# Patient Record
Sex: Female | Born: 1960 | Race: White | Hispanic: No | Marital: Single | Smoking: Never smoker
Health system: Southern US, Community
[De-identification: ages and names within clinical notes are randomized; demographics above are authoritative.]

## PROBLEM LIST (undated history)

## (undated) DIAGNOSIS — E119 Type 2 diabetes mellitus without complications: Secondary | ICD-10-CM

## (undated) DIAGNOSIS — F419 Anxiety disorder, unspecified: Secondary | ICD-10-CM

## (undated) DIAGNOSIS — F439 Reaction to severe stress, unspecified: Secondary | ICD-10-CM

## (undated) DIAGNOSIS — F32A Depression, unspecified: Secondary | ICD-10-CM

## (undated) DIAGNOSIS — I10 Essential (primary) hypertension: Secondary | ICD-10-CM

## (undated) DIAGNOSIS — M25569 Pain in unspecified knee: Secondary | ICD-10-CM

## (undated) HISTORY — PX: BLADDER SURGERY: SHX569

---

## 2016-05-15 ENCOUNTER — Ambulatory Visit (HOSPITAL_COMMUNITY)
Admission: EM | Admit: 2016-05-15 | Discharge: 2016-05-15 | Disposition: A | Discharge status: Home / Self Care | Payer: Self-pay | Attending: Family Medicine | Admitting: Family Medicine

## 2016-05-15 ENCOUNTER — Encounter (HOSPITAL_COMMUNITY): Payer: Self-pay | Admitting: Family Medicine

## 2016-05-15 DIAGNOSIS — F419 Anxiety disorder, unspecified: Secondary | ICD-10-CM

## 2016-05-15 DIAGNOSIS — Z760 Encounter for issue of repeat prescription: Secondary | ICD-10-CM

## 2016-05-15 MED ORDER — OXYBUTYNIN CHLORIDE 5 MG PO TABS: 5.0000 mg | ORAL_TABLET | Freq: Once | ORAL | 3 refills | Status: AC

## 2016-05-15 MED ORDER — MELOXICAM 7.5 MG PO TABS: 7.5000 mg | ORAL_TABLET | Freq: Every day | ORAL | 3 refills | Status: DC

## 2016-05-15 MED ORDER — CITALOPRAM HYDROBROMIDE 40 MG PO TABS: 40.0000 mg | ORAL_TABLET | Freq: Every day | ORAL | 3 refills | Status: DC

## 2016-05-15 MED ORDER — GABAPENTIN 300 MG PO CAPS: 300.0000 mg | ORAL_CAPSULE | Freq: Every day | ORAL | 3 refills | Status: DC

## 2016-05-15 NOTE — ED Provider Notes (Signed)
CSN: [id number]     Arrival date & time 05/15/16  1400 History   First MD Initiated Contact with Patient 05/15/16 1504     Chief Complaint  Patient presents with  . Medication Refill   (Consider location/radiation/quality/duration/timing/severity/associated sxs/prior Treatment) [facility] [place]      Medication Refill  Medications/supplies requested:  Gabapentin Citalopram Meloxicam Oxybutynin Reason for request:  Clinic/provider not available Medications taken before: yes - see home medications   Patient has complete original prescription information: yes     History reviewed. No pertinent past medical history. Past Surgical History:  Procedure Laterality Date  . BLADDER SURGERY     History reviewed. No pertinent family history. Social History  Substance Use Topics  . Smoking status: Never Smoker  . Smokeless tobacco: Never Used  . Alcohol use Not on file   OB History    No data available     Review of Systems  Constitutional: Negative.   HENT: Negative.   Eyes: Negative.   Respiratory: Negative.   Cardiovascular: Negative.   Gastrointestinal: Negative.   Endocrine: Negative.   Genitourinary: Negative.   Musculoskeletal: Negative.   Skin: Negative.   Allergic/Immunologic: Negative.   Neurological: Negative.   Hematological: Negative.   Psychiatric/Behavioral: Negative.     Allergies  Codeine; Doxycycline; and Sulfa antibiotics  Home Medications   Prior to Admission medications   Medication Sig Start Date End Date Taking? Authorizing Provider  citalopram (CELEXA) 40 MG tablet Take 1 tablet (40 mg total) by mouth daily. 05/15/16   Deatra CanterWilliam J Quintin Hjort, FNP  gabapentin (NEURONTIN) 300 MG capsule Take 1 capsule (300 mg total) by mouth at bedtime. 05/15/16   Deatra CanterWilliam J Hayleigh Bawa, FNP  meloxicam (MOBIC) 7.5 MG tablet Take 1 tablet (7.5 mg total) by mouth daily. 05/15/16   Deatra CanterWilliam J Bode Pieper, FNP  oxybutynin (DITROPAN) 5 MG tablet Take 1  tablet (5 mg total) by mouth once. 05/15/16 05/15/16  Deatra CanterWilliam J Khadir Roam, FNP   Meds Ordered and Administered this Visit  Medications - No data to display  BP 121/85   Pulse 82   Temp 98.5 F (36.9 C)   Resp (!) 82   SpO2 100%  No data found.   Physical Exam  Constitutional: She appears well-developed and well-nourished.  HENT:  Head: Normocephalic and atraumatic.  Right Ear: External ear normal.  Left Ear: External ear normal.  Mouth/Throat: Oropharynx is clear and moist.  Eyes: Conjunctivae and EOM are normal. Pupils are equal, round, and reactive to light.  Neck: Normal range of motion. Neck supple.  Cardiovascular: Normal rate, regular rhythm and normal heart sounds.   Pulmonary/Chest: Effort normal and breath sounds normal.  Abdominal: Soft. Bowel sounds are normal.  Nursing note and vitals reviewed.   Urgent Care Course     Procedures (including critical care time)  Labs Review Labs Reviewed - No data to display  Imaging Review No results found.   Visual Acuity Review  Right Eye Distance:   Left Eye Distance:   Bilateral Distance:    Right Eye Near:   Left Eye Near:    Bilateral Near:         MDM   1. Medication refill    Gabapentin  300mg  po qhs Citalopram 40mg  po qd  Meloxicam 7.5mg  one po qd Oxybutynin 5mg  one po qd    Deatra CanterWilliam J Marda Breidenbach, FNP 05/15/16 1549

## 2016-05-15 NOTE — ED Triage Notes (Signed)
Pt here for med refill on all meds.

## 2016-09-27 ENCOUNTER — Ambulatory Visit (HOSPITAL_COMMUNITY)
Admission: EM | Admit: 2016-09-27 | Discharge: 2016-09-27 | Disposition: A | Discharge status: Home / Self Care | Payer: Self-pay

## 2016-09-27 ENCOUNTER — Encounter (HOSPITAL_COMMUNITY): Payer: Self-pay | Admitting: *Deleted

## 2016-09-27 DIAGNOSIS — Z760 Encounter for issue of repeat prescription: Secondary | ICD-10-CM

## 2016-09-27 MED ORDER — OXYBUTYNIN CHLORIDE 5 MG PO TABS: 5.0000 mg | ORAL_TABLET | Freq: Three times a day (TID) | ORAL | 6 refills | Status: DC

## 2016-09-27 MED ORDER — MELOXICAM 7.5 MG PO TABS: 7.5000 mg | ORAL_TABLET | Freq: Every day | ORAL | 6 refills | Status: DC

## 2016-09-27 MED ORDER — CITALOPRAM HYDROBROMIDE 40 MG PO TABS: 40.0000 mg | ORAL_TABLET | Freq: Every day | ORAL | 6 refills | Status: DC

## 2016-09-27 NOTE — ED Triage Notes (Signed)
Pt  Reports      Needs  A  Refill   Of  Her  meds         Verbalizes no  Complaints   Has  No pcp

## 2016-09-27 NOTE — ED Provider Notes (Signed)
CSN: [id number]     Arrival date & time 09/27/16  1005 History   None    Chief Complaint  Patient presents with  . Medication Refill   (Consider location/radiation/quality/duration/timing/severity/associated sxs/prior Treatment) Patient request refill on her medications     Medication Refill  Medications/supplies requested:   citalopram, oxybutynin, meloxicam Reason for request:  Clinic/provider not available Medications taken before: yes - see home medications   Patient has complete original prescription information: yes     History reviewed. No pertinent past medical history. Past Surgical History:  Procedure Laterality Date  . BLADDER SURGERY     History reviewed. No pertinent family history. Social History  Substance Use Topics  . Smoking status: Never Smoker  . Smokeless tobacco: Never Used  . Alcohol use Not on file   OB History    No data available     Review of Systems  Constitutional: Negative.   HENT: Negative.   Eyes: Negative.   Respiratory: Negative.   Cardiovascular: Negative.   Gastrointestinal: Negative.   Endocrine: Negative.   Genitourinary: Negative.   Musculoskeletal: Negative.   Allergic/Immunologic: Negative.   Neurological: Negative.   Hematological: Negative.   Psychiatric/Behavioral: Negative.     Allergies  Codeine; Doxycycline; and Sulfa antibiotics  Home Medications   Prior to Admission medications   Medication Sig Start Date End Date Taking? Authorizing Provider  citalopram (CELEXA) 40 MG tablet Take 1 tablet (40 mg total) by mouth daily. 09/27/16   Deatra Canterxford, Donneisha Beane J, FNP  gabapentin (NEURONTIN) 300 MG capsule Take 1 capsule (300 mg total) by mouth at bedtime. 05/15/16   Deatra Canterxford, Danija Gosa J, FNP  meloxicam (MOBIC) 7.5 MG tablet Take 1 tablet (7.5 mg total) by mouth daily. 09/27/16   Deatra Canterxford, Arryanna Holquin J, FNP  oxybutynin (DITROPAN) 5 MG tablet Take 1 tablet (5 mg total) by mouth 3 (three) times daily. 09/27/16   Deatra Canterxford, Sitlaly Gudiel J, FNP    Meds Ordered and Administered this Visit  Medications - No data to display  BP 129/73 (BP Location: Left Arm)   Pulse 78   Temp 98.6 F (37 C) (Oral)   Resp 18   SpO2 100%  No data found.   Physical Exam  Constitutional: She appears well-developed and well-nourished.  HENT:  Head: Normocephalic and atraumatic.  Eyes: Conjunctivae and EOM are normal. Pupils are equal, round, and reactive to light.  Neck: Normal range of motion. Neck supple.  Cardiovascular: Normal rate, regular rhythm and normal heart sounds.   Pulmonary/Chest: Effort normal and breath sounds normal.  Abdominal: Soft. Bowel sounds are normal.  Nursing note and vitals reviewed.   Urgent Care Course     Procedures (including critical care time)  Labs Review Labs Reviewed - No data to display  Imaging Review No results found.   Visual Acuity Review  Right Eye Distance:   Left Eye Distance:   Bilateral Distance:    Right Eye Near:   Left Eye Near:    Bilateral Near:         MDM  1. Medication refill  Get established with pcp when insurance available.      Deatra CanterOxford, Jacari Iannello J, FNP 09/27/16 1143

## 2017-06-01 ENCOUNTER — Encounter (HOSPITAL_COMMUNITY): Payer: Self-pay | Admitting: Emergency Medicine

## 2017-06-01 ENCOUNTER — Ambulatory Visit (HOSPITAL_COMMUNITY)
Admission: EM | Admit: 2017-06-01 | Discharge: 2017-06-01 | Disposition: A | Discharge status: Home / Self Care | Payer: BLUE CROSS/BLUE SHIELD | Attending: Family Medicine | Admitting: Family Medicine

## 2017-06-01 DIAGNOSIS — R51 Headache: Secondary | ICD-10-CM

## 2017-06-01 DIAGNOSIS — Z760 Encounter for issue of repeat prescription: Secondary | ICD-10-CM | POA: Diagnosis not present

## 2017-06-01 DIAGNOSIS — R519 Headache, unspecified: Secondary | ICD-10-CM

## 2017-06-01 DIAGNOSIS — G4440 Drug-induced headache, not elsewhere classified, not intractable: Secondary | ICD-10-CM | POA: Diagnosis not present

## 2017-06-01 HISTORY — DX: Reaction to severe stress, unspecified: F43.9

## 2017-06-01 HISTORY — DX: Pain in unspecified knee: M25.569

## 2017-06-01 MED ORDER — OXYBUTYNIN CHLORIDE 5 MG PO TABS: 5.0000 mg | ORAL_TABLET | Freq: Three times a day (TID) | ORAL | 6 refills | Status: DC

## 2017-06-01 MED ORDER — KETOROLAC TROMETHAMINE 60 MG/2ML IM SOLN
60.0000 mg | Freq: Once | INTRAMUSCULAR | Status: AC
  Administered 2017-06-01: 60 mg via INTRAMUSCULAR

## 2017-06-01 MED ORDER — CITALOPRAM HYDROBROMIDE 40 MG PO TABS: 40.0000 mg | ORAL_TABLET | Freq: Every day | ORAL | 6 refills | Status: DC

## 2017-06-01 MED ORDER — KETOROLAC TROMETHAMINE 60 MG/2ML IM SOLN
INTRAMUSCULAR | Status: AC
  Filled 2017-06-01: qty 2

## 2017-06-01 MED ORDER — MELOXICAM 7.5 MG PO TABS: 7.5000 mg | ORAL_TABLET | Freq: Every day | ORAL | 6 refills | Status: DC

## 2017-06-01 MED ORDER — GABAPENTIN 300 MG PO CAPS: 300.0000 mg | ORAL_CAPSULE | Freq: Every day | ORAL | 3 refills | Status: AC

## 2017-06-01 NOTE — ED Provider Notes (Signed)
[facility]   [id number] 06/01/17 Arrival Time: 1856   SUBJECTIVE:  Monique Soto is a 57 y.o. female who presents to the urgent care with complaint of out of her medications and needs refills, working on getting PCP, states shes also had a headache x1 week.  Headache began when she ran out of medicine. Patient's had no vomiting, diplopia, hemiplegia. The headache tends to come and go but is present all day long.  Past Medical History:  Diagnosis Date  . Knee pain   . Stress    No family history on file. Social History   Socioeconomic History  . Marital status: Single    Spouse name: Not on file  . Number of children: Not on file  . Years of education: Not on file  . Highest education level: Not on file  Social Needs  . Financial resource strain: Not on file  . Food insecurity - worry: Not on file  . Food insecurity - inability: Not on file  . Transportation needs - medical: Not on file  . Transportation needs - non-medical: Not on file  Occupational History  . Not on file  Tobacco Use  . Smoking status: Never Smoker  . Smokeless tobacco: Never Used  Substance and Sexual Activity  . Alcohol use: Not on file  . Drug use: Not on file  . Sexual activity: Not on file  Other Topics Concern  . Not on file  Social History Narrative  . Not on file   Current Meds  Medication Sig  . [DISCONTINUED] citalopram (CELEXA) 40 MG tablet Take 1 tablet (40 mg total) by mouth daily.  . [DISCONTINUED] gabapentin (NEURONTIN) 300 MG capsule Take 1 capsule (300 mg total) by mouth at bedtime.  . [DISCONTINUED] meloxicam (MOBIC) 7.5 MG tablet Take 1 tablet (7.5 mg total) by mouth daily.  . [DISCONTINUED] oxybutynin (DITROPAN) 5 MG tablet Take 1 tablet (5 mg total) by mouth 3 (three) times daily.   Allergies  Allergen Reactions  . Codeine   . Doxycycline   . Sulfa Antibiotics       ROS: As per HPI, remainder of ROS negative.   OBJECTIVE:   Vitals:   06/01/17 1938  BP: (!) 118/56  Pulse: 72  Resp: 18  Temp: 98.1 F (36.7 C)  SpO2: 99%     General appearance: alert; no distress Eyes: PERRL; EOMI; conjunctiva normal HENT: normocephalic; atraumatic; TMs normal, canal normal, external ears normal without trauma; nasal mucosa normal; oral mucosa normal Neck: supple Lungs: clear to auscultation bilaterally Heart: regular rate and rhythm Back: no CVA tenderness Extremities: no cyanosis or edema; symmetrical with no gross deformities Skin: warm and dry Neurologic: normal gait; grossly normal Psychological: alert and cooperative; normal mood and affect      Labs:  No results found for this or any previous visit.  Labs Reviewed - No data to display  No results found.     ASSESSMENT & PLAN:  1. Bad headache   2. Rebound headache     Meds ordered this encounter  Medications  . ketorolac (TORADOL) injection 60 mg  . citalopram (CELEXA) 40 MG tablet    Sig: Take 1 tablet (40 mg total) by mouth daily.    Dispense:  30 tablet    Refill:  6  . gabapentin (NEURONTIN) 300 MG capsule    Sig: Take 1 capsule (300 mg total) by mouth at bedtime.    Dispense:  30 capsule    Refill:  3  .  meloxicam (MOBIC) 7.5 MG tablet    Sig: Take 1 tablet (7.5 mg total) by mouth daily.    Dispense:  30 tablet    Refill:  6  . oxybutynin (DITROPAN) 5 MG tablet    Sig: Take 1 tablet (5 mg total) by mouth 3 (three) times daily.    Dispense:  30 tablet    Refill:  6    Reviewed expectations re: course of current medical issues. Questions answered. Outlined signs and symptoms indicating need for more acute intervention. Patient verbalized understanding. After Visit Summary given.    Procedures:      Elvina Sidle, MD 06/01/17 2031

## 2017-06-01 NOTE — ED Triage Notes (Signed)
Pt has been out of her medications and needs refills, working on getting PCP, states shes also had a headache x1 week.

## 2018-11-26 ENCOUNTER — Other Ambulatory Visit: Payer: Self-pay | Admitting: Obstetrics and Gynecology

## 2018-11-26 ENCOUNTER — Other Ambulatory Visit (HOSPITAL_COMMUNITY)
Admission: RE | Admit: 2018-11-26 | Discharge: 2018-11-26 | Disposition: A | Discharge status: Home / Self Care | Payer: BLUE CROSS/BLUE SHIELD | Source: Ambulatory Visit | Attending: Obstetrics and Gynecology | Admitting: Obstetrics and Gynecology

## 2018-11-26 DIAGNOSIS — Z124 Encounter for screening for malignant neoplasm of cervix: Secondary | ICD-10-CM | POA: Insufficient documentation

## 2018-11-27 LAB — CYTOLOGY - PAP

## 2020-07-28 HISTORY — PX: KNEE ARTHROSCOPY: SHX127

## 2020-08-08 ENCOUNTER — Observation Stay (HOSPITAL_COMMUNITY)
Admission: EM | Admit: 2020-08-08 | Discharge: 2020-08-10 | Disposition: A | Discharge status: Home / Self Care | Payer: BLUE CROSS/BLUE SHIELD | Attending: Emergency Medicine | Admitting: Emergency Medicine

## 2020-08-08 DIAGNOSIS — R109 Unspecified abdominal pain: Secondary | ICD-10-CM | POA: Diagnosis present

## 2020-08-08 DIAGNOSIS — K810 Acute cholecystitis: Secondary | ICD-10-CM | POA: Diagnosis present

## 2020-08-08 DIAGNOSIS — Z20822 Contact with and (suspected) exposure to covid-19: Secondary | ICD-10-CM | POA: Diagnosis not present

## 2020-08-08 DIAGNOSIS — K8012 Calculus of gallbladder with acute and chronic cholecystitis without obstruction: Principal | ICD-10-CM | POA: Insufficient documentation

## 2020-08-08 DIAGNOSIS — K819 Cholecystitis, unspecified: Secondary | ICD-10-CM

## 2020-08-08 HISTORY — DX: Anxiety disorder, unspecified: F41.9

## 2020-08-08 HISTORY — DX: Type 2 diabetes mellitus without complications: E11.9

## 2020-08-08 HISTORY — DX: Essential (primary) hypertension: I10

## 2020-08-08 HISTORY — DX: Depression, unspecified: F32.A

## 2020-08-08 NOTE — ED Triage Notes (Signed)
Brought in by [facility] EMS from home, c/o chest pain, nausea and vomiting - Cp started when she woke up this morning, constant, substernal, crushing non radiating. Nausea and vomiting started around 1700.    324mg  aspirin prior to EMS arrival. 4mg  zofran ODT.

## 2020-08-09 ENCOUNTER — Emergency Department (HOSPITAL_COMMUNITY): Payer: BLUE CROSS/BLUE SHIELD | Admitting: Anesthesiology

## 2020-08-09 ENCOUNTER — Other Ambulatory Visit: Payer: Self-pay

## 2020-08-09 ENCOUNTER — Emergency Department (HOSPITAL_COMMUNITY): Payer: BLUE CROSS/BLUE SHIELD

## 2020-08-09 ENCOUNTER — Encounter (HOSPITAL_COMMUNITY): Payer: Self-pay

## 2020-08-09 ENCOUNTER — Encounter (HOSPITAL_COMMUNITY)
Admission: EM | Disposition: A | Discharge status: Home / Self Care | Payer: Self-pay | Source: Home / Self Care | Attending: Emergency Medicine

## 2020-08-09 DIAGNOSIS — K810 Acute cholecystitis: Secondary | ICD-10-CM | POA: Diagnosis present

## 2020-08-09 HISTORY — PX: CHOLECYSTECTOMY: SHX55

## 2020-08-09 LAB — CBC WITH DIFFERENTIAL/PLATELET
Abs Immature Granulocytes: 0.05 10*3/uL (ref 0.00–0.07)
Basophils Absolute: 0.1 10*3/uL (ref 0.0–0.1)
Basophils Relative: 1 %
Eosinophils Absolute: 0 10*3/uL (ref 0.0–0.5)
Eosinophils Relative: 0 %
HCT: 41.7 % (ref 36.0–46.0)
Hemoglobin: 14.5 g/dL (ref 12.0–15.0)
Immature Granulocytes: 0 %
Lymphocytes Relative: 10 %
Lymphs Abs: 1.3 10*3/uL (ref 0.7–4.0)
MCH: 31.6 pg (ref 26.0–34.0)
MCHC: 34.8 g/dL (ref 30.0–36.0)
MCV: 90.8 fL (ref 80.0–100.0)
Monocytes Absolute: 0.6 10*3/uL (ref 0.1–1.0)
Monocytes Relative: 5 %
Neutro Abs: 11.2 10*3/uL — ABNORMAL HIGH (ref 1.7–7.7)
Neutrophils Relative %: 84 %
Platelets: 320 10*3/uL (ref 150–400)
RBC: 4.59 MIL/uL (ref 3.87–5.11)
RDW: 12.1 % (ref 11.5–15.5)
WBC: 13.3 10*3/uL — ABNORMAL HIGH (ref 4.0–10.5)
nRBC: 0 % (ref 0.0–0.2)

## 2020-08-09 LAB — URINALYSIS, ROUTINE W REFLEX MICROSCOPIC
Bacteria, UA: NONE SEEN
Bilirubin Urine: NEGATIVE
Glucose, UA: >=500 mg/dL — AB
Ketones, ur: 80 mg/dL — AB
Leukocytes,Ua: NEGATIVE
Nitrite: NEGATIVE
Protein, ur: NEGATIVE mg/dL
Specific Gravity, Urine: 1.036 — ABNORMAL HIGH (ref 1.005–1.030)
pH: 6 (ref 5.0–8.0)

## 2020-08-09 LAB — RESP PANEL BY RT-PCR (FLU A&B, COVID) ARPGX2
Influenza A by PCR: NEGATIVE
Influenza B by PCR: NEGATIVE
SARS Coronavirus 2 by RT PCR: NEGATIVE

## 2020-08-09 LAB — CBC
HCT: 41.9 % (ref 36.0–46.0)
Hemoglobin: 14.3 g/dL (ref 12.0–15.0)
MCH: 31.2 pg (ref 26.0–34.0)
MCHC: 34.1 g/dL (ref 30.0–36.0)
MCV: 91.3 fL (ref 80.0–100.0)
Platelets: 297 10*3/uL (ref 150–400)
RBC: 4.59 MIL/uL (ref 3.87–5.11)
RDW: 12.1 % (ref 11.5–15.5)
WBC: 16.6 10*3/uL — ABNORMAL HIGH (ref 4.0–10.5)
nRBC: 0 % (ref 0.0–0.2)

## 2020-08-09 LAB — CREATININE, SERUM
Creatinine, Ser: 0.71 mg/dL (ref 0.44–1.00)
GFR, Estimated: >60 mL/min (ref >60)

## 2020-08-09 LAB — LIPASE, BLOOD: Lipase: 29 U/L (ref 11–51)

## 2020-08-09 LAB — COMPREHENSIVE METABOLIC PANEL
ALT: 24 U/L (ref 0–44)
AST: 20 U/L (ref 15–41)
Albumin: 4 g/dL (ref 3.5–5.0)
Alkaline Phosphatase: 62 U/L (ref 38–126)
Anion gap: 12 (ref 5–15)
BUN: 7 mg/dL — ABNORMAL LOW (ref 8–23)
CO2: 22 mmol/L (ref 22–32)
Calcium: 9.1 mg/dL (ref 8.9–10.3)
Chloride: 98 mmol/L (ref 98–111)
Creatinine, Ser: 0.78 mg/dL (ref 0.44–1.00)
GFR, Estimated: >60 mL/min (ref >60)
Glucose, Bld: 164 mg/dL — ABNORMAL HIGH (ref 70–99)
Potassium: 4.2 mmol/L (ref 3.5–5.1)
Sodium: 132 mmol/L — ABNORMAL LOW (ref 135–145)
Total Bilirubin: 0.9 mg/dL (ref 0.3–1.2)
Total Protein: 7.1 g/dL (ref 6.5–8.1)

## 2020-08-09 LAB — GLUCOSE, CAPILLARY
Glucose-Capillary: 137 mg/dL — ABNORMAL HIGH (ref 70–99)
Glucose-Capillary: 159 mg/dL — ABNORMAL HIGH (ref 70–99)
Glucose-Capillary: 290 mg/dL — ABNORMAL HIGH (ref 70–99)
Glucose-Capillary: 196 mg/dL — ABNORMAL HIGH (ref 70–99)

## 2020-08-09 LAB — HEMOGLOBIN A1C
Hgb A1c MFr Bld: 6.6 % — ABNORMAL HIGH (ref 4.8–5.6)
Mean Plasma Glucose: 142.72 mg/dL

## 2020-08-09 SURGERY — LAPAROSCOPIC CHOLECYSTECTOMY
Anesthesia: General | Site: Abdomen

## 2020-08-09 MED ORDER — SODIUM CHLORIDE 0.9 % IV SOLN
INTRAVENOUS | Status: AC
  Filled 2020-08-09: qty 20

## 2020-08-09 MED ORDER — LACTATED RINGERS IV SOLN: INTRAVENOUS | Status: DC

## 2020-08-09 MED ORDER — ONDANSETRON 4 MG PO TBDP: 4.0000 mg | ORAL_TABLET | Freq: Four times a day (QID) | ORAL | Status: DC | PRN

## 2020-08-09 MED ORDER — ONDANSETRON HCL 4 MG/2ML IJ SOLN
INTRAMUSCULAR | Status: DC | PRN
  Administered 2020-08-09: 4 mg via INTRAVENOUS

## 2020-08-09 MED ORDER — DIPHENHYDRAMINE HCL 50 MG/ML IJ SOLN: 25.0000 mg | Freq: Four times a day (QID) | INTRAMUSCULAR | Status: DC | PRN

## 2020-08-09 MED ORDER — KETOROLAC TROMETHAMINE 15 MG/ML IJ SOLN: 15.0000 mg | Freq: Four times a day (QID) | INTRAMUSCULAR | Status: DC | PRN

## 2020-08-09 MED ORDER — INDOCYANINE GREEN 25 MG IV SOLR
7.5000 mg | Freq: Once | INTRAVENOUS | Status: DC
  Filled 2020-08-09: qty 10

## 2020-08-09 MED ORDER — INSULIN ASPART 100 UNIT/ML IJ SOLN
0.0000 [IU] | Freq: Three times a day (TID) | INTRAMUSCULAR | Status: DC
  Administered 2020-08-09: 11 [IU] via SUBCUTANEOUS
  Administered 2020-08-10: 4 [IU] via SUBCUTANEOUS

## 2020-08-09 MED ORDER — DEXAMETHASONE SODIUM PHOSPHATE 10 MG/ML IJ SOLN
INTRAMUSCULAR | Status: DC | PRN
  Administered 2020-08-09: 5 mg via INTRAVENOUS

## 2020-08-09 MED ORDER — DEXAMETHASONE SODIUM PHOSPHATE 10 MG/ML IJ SOLN
INTRAMUSCULAR | Status: AC
  Filled 2020-08-09: qty 1

## 2020-08-09 MED ORDER — FENTANYL CITRATE (PF) 100 MCG/2ML IJ SOLN
50.0000 ug | INTRAMUSCULAR | Status: DC | PRN
  Administered 2020-08-09 (×2): 50 ug via INTRAVENOUS
  Filled 2020-08-09 (×2): qty 2

## 2020-08-09 MED ORDER — PROPOFOL 10 MG/ML IV BOLUS
INTRAVENOUS | Status: AC
  Filled 2020-08-09: qty 20

## 2020-08-09 MED ORDER — POLYETHYLENE GLYCOL 3350 17 G PO PACK: 17.0000 g | PACK | Freq: Every day | ORAL | Status: DC | PRN

## 2020-08-09 MED ORDER — SODIUM CHLORIDE 0.9 % IV SOLN
2.0000 g | Freq: Once | INTRAVENOUS | Status: AC
  Administered 2020-08-09: 2 g via INTRAVENOUS
  Filled 2020-08-09: qty 20

## 2020-08-09 MED ORDER — PROPOFOL 10 MG/ML IV BOLUS
INTRAVENOUS | Status: DC | PRN
  Administered 2020-08-09: 110 mg via INTRAVENOUS

## 2020-08-09 MED ORDER — SIMETHICONE 80 MG PO CHEW
40.0000 mg | CHEWABLE_TABLET | Freq: Four times a day (QID) | ORAL | Status: DC | PRN
  Filled 2020-08-09: qty 1

## 2020-08-09 MED ORDER — ACETAMINOPHEN 500 MG PO TABS
1000.0000 mg | ORAL_TABLET | ORAL | Status: AC
  Administered 2020-08-09: 1000 mg via ORAL

## 2020-08-09 MED ORDER — LORATADINE 10 MG PO TABS
10.0000 mg | ORAL_TABLET | Freq: Every day | ORAL | Status: DC
  Administered 2020-08-10: 10 mg via ORAL
  Filled 2020-08-09: qty 1

## 2020-08-09 MED ORDER — LIDOCAINE 2% (20 MG/ML) 5 ML SYRINGE
INTRAMUSCULAR | Status: AC
  Filled 2020-08-09: qty 5

## 2020-08-09 MED ORDER — SODIUM CHLORIDE 0.9 % IV SOLN: Freq: Once | INTRAVENOUS | Status: AC

## 2020-08-09 MED ORDER — HYDROCODONE-ACETAMINOPHEN 10-325 MG PO TABS
1.0000 | ORAL_TABLET | Freq: Four times a day (QID) | ORAL | Status: DC | PRN
  Administered 2020-08-10: 1 via ORAL
  Filled 2020-08-09: qty 1

## 2020-08-09 MED ORDER — METOPROLOL TARTRATE 5 MG/5ML IV SOLN
5.0000 mg | Freq: Four times a day (QID) | INTRAVENOUS | Status: DC | PRN
  Filled 2020-08-09: qty 5

## 2020-08-09 MED ORDER — BUPROPION HCL ER (SR) 150 MG PO TB12
150.0000 mg | ORAL_TABLET | Freq: Two times a day (BID) | ORAL | Status: DC
  Administered 2020-08-09 – 2020-08-10 (×2): 150 mg via ORAL
  Filled 2020-08-09 (×2): qty 1

## 2020-08-09 MED ORDER — SUGAMMADEX SODIUM 200 MG/2ML IV SOLN
INTRAVENOUS | Status: DC | PRN
  Administered 2020-08-09: 200 mg via INTRAVENOUS

## 2020-08-09 MED ORDER — METOCLOPRAMIDE HCL 5 MG/ML IJ SOLN
5.0000 mg | Freq: Once | INTRAMUSCULAR | Status: AC
  Administered 2020-08-09: 5 mg via INTRAVENOUS
  Filled 2020-08-09: qty 2

## 2020-08-09 MED ORDER — CHLORHEXIDINE GLUCONATE 0.12 % MT SOLN
15.0000 mL | Freq: Once | OROMUCOSAL | Status: AC
  Administered 2020-08-09: 15 mL via OROMUCOSAL

## 2020-08-09 MED ORDER — LIDOCAINE 2% (20 MG/ML) 5 ML SYRINGE
INTRAMUSCULAR | Status: DC | PRN
  Administered 2020-08-09: 60 mg via INTRAVENOUS

## 2020-08-09 MED ORDER — HEMOSTATIC AGENTS (NO CHARGE) OPTIME
TOPICAL | Status: DC | PRN
  Administered 2020-08-09: 1 via TOPICAL

## 2020-08-09 MED ORDER — 0.9 % SODIUM CHLORIDE (POUR BTL) OPTIME
TOPICAL | Status: DC | PRN
  Administered 2020-08-09: 1000 mL

## 2020-08-09 MED ORDER — ONDANSETRON HCL 4 MG/2ML IJ SOLN
INTRAMUSCULAR | Status: AC
  Filled 2020-08-09: qty 4

## 2020-08-09 MED ORDER — SODIUM CHLORIDE 0.9 % IV SOLN: INTRAVENOUS | Status: DC

## 2020-08-09 MED ORDER — BUPIVACAINE-EPINEPHRINE (PF) 0.25% -1:200000 IJ SOLN
INTRAMUSCULAR | Status: AC
  Filled 2020-08-09: qty 30

## 2020-08-09 MED ORDER — ONDANSETRON HCL 4 MG/2ML IJ SOLN: 4.0000 mg | Freq: Four times a day (QID) | INTRAMUSCULAR | Status: DC | PRN

## 2020-08-09 MED ORDER — FENTANYL CITRATE (PF) 250 MCG/5ML IJ SOLN
INTRAMUSCULAR | Status: AC
  Filled 2020-08-09: qty 5

## 2020-08-09 MED ORDER — SODIUM CHLORIDE 0.9 % IR SOLN
Status: DC | PRN
  Administered 2020-08-09: 1000 mL

## 2020-08-09 MED ORDER — FENTANYL CITRATE (PF) 100 MCG/2ML IJ SOLN: 25.0000 ug | INTRAMUSCULAR | Status: DC | PRN

## 2020-08-09 MED ORDER — FENTANYL CITRATE (PF) 250 MCG/5ML IJ SOLN
INTRAMUSCULAR | Status: DC | PRN
  Administered 2020-08-09: 100 ug via INTRAVENOUS
  Administered 2020-08-09 (×2): 50 ug via INTRAVENOUS

## 2020-08-09 MED ORDER — ONDANSETRON HCL 4 MG/2ML IJ SOLN
4.0000 mg | Freq: Once | INTRAMUSCULAR | Status: AC
  Administered 2020-08-09: 4 mg via INTRAVENOUS
  Filled 2020-08-09: qty 2

## 2020-08-09 MED ORDER — ACETAMINOPHEN 500 MG PO TABS
ORAL_TABLET | ORAL | Status: AC
  Filled 2020-08-09: qty 2

## 2020-08-09 MED ORDER — LISINOPRIL 2.5 MG PO TABS
2.5000 mg | ORAL_TABLET | ORAL | Status: DC
  Administered 2020-08-09: 2.5 mg via ORAL
  Filled 2020-08-09: qty 1

## 2020-08-09 MED ORDER — SUCCINYLCHOLINE CHLORIDE 200 MG/10ML IV SOSY
PREFILLED_SYRINGE | INTRAVENOUS | Status: AC
  Filled 2020-08-09: qty 10

## 2020-08-09 MED ORDER — CHLORHEXIDINE GLUCONATE 0.12 % MT SOLN
OROMUCOSAL | Status: AC
  Filled 2020-08-09: qty 15

## 2020-08-09 MED ORDER — ROCURONIUM BROMIDE 10 MG/ML (PF) SYRINGE
PREFILLED_SYRINGE | INTRAVENOUS | Status: DC | PRN
  Administered 2020-08-09: 30 mg via INTRAVENOUS

## 2020-08-09 MED ORDER — MIDAZOLAM HCL 2 MG/2ML IJ SOLN
INTRAMUSCULAR | Status: AC
  Filled 2020-08-09: qty 2

## 2020-08-09 MED ORDER — INSULIN ASPART 100 UNIT/ML IJ SOLN: 0.0000 [IU] | Freq: Every day | INTRAMUSCULAR | Status: DC

## 2020-08-09 MED ORDER — ORAL CARE MOUTH RINSE: 15.0000 mL | Freq: Once | OROMUCOSAL | Status: AC

## 2020-08-09 MED ORDER — ROCURONIUM BROMIDE 10 MG/ML (PF) SYRINGE
PREFILLED_SYRINGE | INTRAVENOUS | Status: AC
  Filled 2020-08-09: qty 10

## 2020-08-09 MED ORDER — SODIUM CHLORIDE 0.9 % IV SOLN
8.0000 mg | Freq: Once | INTRAVENOUS | Status: AC
  Administered 2020-08-09: 8 mg via INTRAVENOUS
  Filled 2020-08-09: qty 4

## 2020-08-09 MED ORDER — DIPHENHYDRAMINE HCL 25 MG PO CAPS: 25.0000 mg | ORAL_CAPSULE | Freq: Four times a day (QID) | ORAL | Status: DC | PRN

## 2020-08-09 MED ORDER — OXYBUTYNIN CHLORIDE ER 10 MG PO TB24
10.0000 mg | ORAL_TABLET | Freq: Every day | ORAL | Status: DC
  Administered 2020-08-10: 10 mg via ORAL
  Filled 2020-08-09: qty 1

## 2020-08-09 MED ORDER — ACETAMINOPHEN 500 MG PO TABS
1000.0000 mg | ORAL_TABLET | Freq: Four times a day (QID) | ORAL | Status: DC
  Administered 2020-08-09 – 2020-08-10 (×4): 1000 mg via ORAL
  Filled 2020-08-09 (×4): qty 2

## 2020-08-09 MED ORDER — MIDAZOLAM HCL 2 MG/2ML IJ SOLN
INTRAMUSCULAR | Status: DC | PRN
  Administered 2020-08-09: 2 mg via INTRAVENOUS

## 2020-08-09 MED ORDER — FENTANYL CITRATE (PF) 100 MCG/2ML IJ SOLN
50.0000 ug | Freq: Once | INTRAMUSCULAR | Status: AC
  Administered 2020-08-09: 50 ug via INTRAVENOUS
  Filled 2020-08-09: qty 2

## 2020-08-09 MED ORDER — GABAPENTIN 300 MG PO CAPS: 300.0000 mg | ORAL_CAPSULE | Freq: Every evening | ORAL | Status: DC | PRN

## 2020-08-09 MED ORDER — SUCCINYLCHOLINE CHLORIDE 200 MG/10ML IV SOSY
PREFILLED_SYRINGE | INTRAVENOUS | Status: DC | PRN
  Administered 2020-08-09: 180 mg via INTRAVENOUS

## 2020-08-09 MED ORDER — ONDANSETRON HCL 4 MG/2ML IJ SOLN
INTRAMUSCULAR | Status: AC
  Filled 2020-08-09: qty 2

## 2020-08-09 MED ORDER — BUPIVACAINE-EPINEPHRINE 0.25% -1:200000 IJ SOLN
INTRAMUSCULAR | Status: DC | PRN
  Administered 2020-08-09: 11 mL

## 2020-08-09 MED ORDER — ENOXAPARIN SODIUM 40 MG/0.4ML IJ SOSY
40.0000 mg | PREFILLED_SYRINGE | INTRAMUSCULAR | Status: DC
  Administered 2020-08-10: 40 mg via SUBCUTANEOUS
  Filled 2020-08-09: qty 0.4

## 2020-08-09 MED ORDER — METHOCARBAMOL 500 MG PO TABS
500.0000 mg | ORAL_TABLET | Freq: Four times a day (QID) | ORAL | Status: DC | PRN
  Administered 2020-08-10: 500 mg via ORAL
  Filled 2020-08-09: qty 1

## 2020-08-09 MED ORDER — FENTANYL CITRATE (PF) 100 MCG/2ML IJ SOLN
100.0000 ug | Freq: Once | INTRAMUSCULAR | Status: AC
  Administered 2020-08-09: 100 ug via INTRAVENOUS
  Filled 2020-08-09: qty 2

## 2020-08-09 SURGICAL SUPPLY — 37 items
APPLIER CLIP 5 13 M/L LIGAMAX5 (MISCELLANEOUS) ×2
CANISTER SUCT 3000ML PPV (MISCELLANEOUS) ×2 IMPLANT
CHLORAPREP W/TINT 26 (MISCELLANEOUS) ×2 IMPLANT
CLIP APPLIE 5 13 M/L LIGAMAX5 (MISCELLANEOUS) ×1 IMPLANT
COVER SURGICAL LIGHT HANDLE (MISCELLANEOUS) ×2 IMPLANT
COVER WAND RF STERILE (DRAPES) ×2 IMPLANT
DERMABOND ADVANCED (GAUZE/BANDAGES/DRESSINGS) ×1
DERMABOND ADVANCED .7 DNX12 (GAUZE/BANDAGES/DRESSINGS) ×1 IMPLANT
ELECT REM PT RETURN 9FT ADLT (ELECTROSURGICAL) ×2
ELECTRODE REM PT RTRN 9FT ADLT (ELECTROSURGICAL) ×1 IMPLANT
GLOVE BIO SURGEON STRL SZ7 (GLOVE) ×2 IMPLANT
GLOVE BIOGEL PI IND STRL 7.5 (GLOVE) ×1 IMPLANT
GLOVE BIOGEL PI INDICATOR 7.5 (GLOVE) ×1
GOWN STRL REUS W/ TWL LRG LVL3 (GOWN DISPOSABLE) ×3 IMPLANT
GOWN STRL REUS W/TWL LRG LVL3 (GOWN DISPOSABLE) ×3
GRASPER SUT TROCAR 14GX15 (MISCELLANEOUS) ×2 IMPLANT
HEMOSTAT SNOW SURGICEL 2X4 (HEMOSTASIS) ×2 IMPLANT
KIT BASIN OR (CUSTOM PROCEDURE TRAY) ×2 IMPLANT
KIT TURNOVER KIT B (KITS) ×2 IMPLANT
NS IRRIG 1000ML POUR BTL (IV SOLUTION) ×2 IMPLANT
PAD ARMBOARD 7.5X6 YLW CONV (MISCELLANEOUS) ×2 IMPLANT
POUCH RETRIEVAL ECOSAC 10 (ENDOMECHANICALS) ×1 IMPLANT
POUCH RETRIEVAL ECOSAC 10MM (ENDOMECHANICALS) ×1
SCISSORS LAP 5X35 DISP (ENDOMECHANICALS) ×2 IMPLANT
SET IRRIG TUBING LAPAROSCOPIC (IRRIGATION / IRRIGATOR) ×2 IMPLANT
SET TUBE SMOKE EVAC HIGH FLOW (TUBING) ×2 IMPLANT
SLEEVE ENDOPATH XCEL 5M (ENDOMECHANICALS) ×4 IMPLANT
SPECIMEN JAR SMALL (MISCELLANEOUS) ×2 IMPLANT
STRIP CLOSURE SKIN 1/2X4 (GAUZE/BANDAGES/DRESSINGS) ×2 IMPLANT
SUT MNCRL AB 4-0 PS2 18 (SUTURE) ×2 IMPLANT
SUT VICRYL 0 UR6 27IN ABS (SUTURE) ×2 IMPLANT
TOWEL GREEN STERILE (TOWEL DISPOSABLE) ×2 IMPLANT
TOWEL GREEN STERILE FF (TOWEL DISPOSABLE) ×2 IMPLANT
TRAY LAPAROSCOPIC MC (CUSTOM PROCEDURE TRAY) ×2 IMPLANT
TROCAR XCEL BLUNT TIP 100MML (ENDOMECHANICALS) ×2 IMPLANT
TROCAR XCEL NON-BLD 5MMX100MML (ENDOMECHANICALS) ×2 IMPLANT
WATER STERILE IRR 1000ML POUR (IV SOLUTION) ×2 IMPLANT

## 2020-08-09 NOTE — Interval H&P Note (Signed)
History and Physical Interval Note:  08/09/2020 8:08 AM I have seen and examined patient.  She has murphys sign gallstones and hydrops on [place].  Plan lap chole this am I discussed the procedure in detail.  We discussed the risks and benefits of a laparoscopic cholecystectomy and possible cholangiogram including, but not limited to bleeding, infection, injury to surrounding structures such as the intestine or liver, bile leak, retained gallstones, need to convert to an open procedure, subtotal, prolonged diarrhea, blood clots such as  DVT, common bile duct injury, anesthesia risks, and possible need for additional procedures.  The likelihood of improvement in symptoms and return to the patient's normal status is good. We discussed the typical post-operative recovery course.  Monique Soto  has presented today for surgery, with the diagnosis of Cholecystitis.  The various methods of treatment have been discussed with the patient and family. After consideration of risks, benefits and other options for treatment, the patient has consented to  Procedure(s): LAPAROSCOPIC CHOLECYSTECTOMY (N/A) as a surgical intervention.  The patient's history has been reviewed, patient examined, no change in status, stable for surgery.  I have reviewed the patient's chart and labs.  Questions were answered to the patient's satisfaction.     Monique Soto

## 2020-08-09 NOTE — Anesthesia Procedure Notes (Signed)
Procedure Name: Intubation Date/Time: 08/09/2020 12:18 PM Performed by: Mayer Camel, CRNA Pre-anesthesia Checklist: Patient identified, Emergency Drugs available, Suction available and Patient being monitored Patient Re-evaluated:Patient Re-evaluated prior to induction Oxygen Delivery Method: Circle System Utilized Preoxygenation: Pre-oxygenation with 100% oxygen Induction Type: IV induction Ventilation: Mask ventilation without difficulty Laryngoscope Size: Miller and 2 Grade View: Grade I Tube type: Oral Tube size: 7.0 mm Number of attempts: 1 Airway Equipment and Method: Stylet and Oral airway Placement Confirmation: ETT inserted through vocal cords under direct vision,  positive ETCO2 and breath sounds checked- equal and bilateral Secured at: 22 cm Tube secured with: Tape Dental Injury: Teeth and Oropharynx as per pre-operative assessment

## 2020-08-09 NOTE — ED Provider Notes (Signed)
[facility] EMERGENCY DEPARTMENT Provider Note   CSN: [id number] Arrival date & time: 08/08/20  2356     History Chief Complaint  Patient presents with  . Chest Pain  . Nausea  . Emesis    Tyjanae Bartek is a 60 y.o. female.  Patient presents to the emergency department with a chief complaint of epigastric or right upper quadrant pain.  She reports associated nausea and vomiting.  She states symptoms started yesterday morning.  She states that initially started in her right flank/back.  She states that is working well and her right upper abdomen does not radiate to her chest.  She reports persistent vomiting.  She denies any fevers or chills.  Denies any dysuria or hematuria.  Denies any successful treatments prior to arrival.  The history is provided by the patient. No language interpreter was used.       Past Medical History:  Diagnosis Date  . Knee pain   . Stress     There are no problems to display for this patient.   Past Surgical History:  Procedure Laterality Date  . BLADDER SURGERY       OB History   No obstetric history on file.     History reviewed. No pertinent family history.  Social History   Tobacco Use  . Smoking status: Never Smoker  . Smokeless tobacco: Never Used    Home Medications Prior to Admission medications   Medication Sig Start Date End Date Taking? Authorizing Provider  citalopram (CELEXA) 40 MG tablet Take 1 tablet (40 mg total) by mouth daily. 06/01/17   Elvina Sidle, MD  gabapentin (NEURONTIN) 300 MG capsule Take 1 capsule (300 mg total) by mouth at bedtime. 06/01/17   Elvina Sidle, MD  meloxicam (MOBIC) 7.5 MG tablet Take 1 tablet (7.5 mg total) by mouth daily. 06/01/17   Elvina Sidle, MD  oxybutynin (DITROPAN) 5 MG tablet Take 1 tablet (5 mg total) by mouth 3 (three) times daily. 06/01/17   Elvina Sidle, MD    Allergies    Codeine, Doxycycline, and Sulfa antibiotics  Review of Systems    Review of Systems  All other systems reviewed and are negative.   Physical Exam Updated Vital Signs BP 127/88   Pulse 89   Temp 98.6 F (37 C) (Oral)   Resp 16   SpO2 96%   Physical Exam Vitals and nursing note reviewed.  Constitutional:      General: She is not in acute distress.    Appearance: She is well-developed.  HENT:     Head: Normocephalic and atraumatic.  Eyes:     Conjunctiva/sclera: Conjunctivae normal.  Cardiovascular:     Rate and Rhythm: Normal rate and regular rhythm.     Heart sounds: No murmur heard.   Pulmonary:     Effort: Pulmonary effort is normal. No respiratory distress.     Breath sounds: Normal breath sounds.  Abdominal:     Palpations: Abdomen is soft.     Tenderness: There is abdominal tenderness.     Comments: Right upper quadrant tenderness to palpation  Musculoskeletal:        General: Normal range of motion.     Cervical back: Neck supple.  Skin:    General: Skin is warm and dry.  Neurological:     Mental Status: She is alert and oriented to person, place, and time.  Psychiatric:        Mood and Affect: Mood normal.  Behavior: Behavior normal.     ED Results / Procedures / Treatments   Labs (all labs ordered are listed, but only abnormal results are displayed) Labs Reviewed  COMPREHENSIVE METABOLIC PANEL - Abnormal; Notable for the following components:      Result Value   Sodium 132 (*)    Glucose, Bld 164 (*)    BUN 7 (*)    All other components within normal limits  CBC WITH DIFFERENTIAL/PLATELET - Abnormal; Notable for the following components:   WBC 13.3 (*)    Neutro Abs 11.2 (*)    All other components within normal limits  RESP PANEL BY RT-PCR (FLU A&B, COVID) ARPGX2  LIPASE, BLOOD  URINALYSIS, ROUTINE W REFLEX MICROSCOPIC    EKG None  Radiology DG Chest 2 View  Result Date: 08/09/2020 CLINICAL DATA:  Right upper quadrant pain EXAM: CHEST - 2 VIEW COMPARISON:  None. FINDINGS: The heart size and  mediastinal contours are within normal limits. Both lungs are clear. The visualized skeletal structures are unremarkable. IMPRESSION: No active cardiopulmonary disease. Electronically Signed   By: Deatra Robinson M.D.   On: 08/09/2020 01:00   US Abdomen Limited  Result Date: 08/09/2020 CLINICAL DATA:  Right upper quadrant pain.  Pre-medicated. EXAM: ULTRASOUND ABDOMEN LIMITED RIGHT UPPER QUADRANT COMPARISON:  None. FINDINGS: Gallbladder: Hydropic gallbladder. Non-mobile 2.2 cm gallstone within the region of the gallbladder neck. Gallbladder sludge identified. Positive sonographic Murphy sign noted by sonographer. Common bile duct: Diameter: 7 mm Liver: No focal lesion identified. Increased parenchymal echogenicity. Portal vein is patent on color Doppler imaging with normal direction of blood flow towards the liver. Other: None. IMPRESSION: 1. Impacted gallstone within the gallbladder neck with associated acute cholecystitis. Also noted gallbladder sludge. 2. Hepatic steatosis. Please note limited evaluation for focal hepatic masses in a patient with hepatic steatosis due to decreased penetration of the acoustic ultrasound waves. Electronically Signed   By: Tish Frederickson M.D.   On: 08/09/2020 01:06    Procedures Procedures   Medications Ordered in ED Medications  ondansetron (ZOFRAN) injection 4 mg (4 mg Intravenous Given 08/09/20 0012)  fentaNYL (SUBLIMAZE) injection 50 mcg (50 mcg Intravenous Given 08/09/20 0012)    ED Course  I have reviewed the triage vital signs and the nursing notes.  Pertinent labs & imaging results that were available during my care of the patient were reviewed by me and considered in my medical decision making (see chart for details).    MDM Rules/Calculators/A&P                          This patient complains of RUQ pain and vomiting, this involves an extensive number of treatment options, and is a complaint that carries with it a high risk of complications and  morbidity.    Differential Dx Cholecystitis, KS, gastro  Pertinent Labs I ordered, reviewed, and interpreted labs, which included CBC for leukocytosis to 13.3, normal platelets, normal t. bili  Imaging Interpretation I ordered imaging studies which included RUQ [place] and CXR, which showed impacted gallstone with cholecystitis.   Medications I ordered medication fentanyl and zofran for pain and vomiting.  Reassessments After the interventions stated above, I reevaluated the patient and found still having pain and nausea.  Ordered more fentanyl.  Consultants Dr. Donell Beers, who will come to see the patient.  Plan Admit    Final Clinical Impression(s) / ED Diagnoses Final diagnoses:  Cholecystitis    Rx / DC Orders ED  Discharge Orders    None       Roxy Horseman, PA-C 08/09/20 6599    Gilda Crease, MD 08/10/20 [phone]

## 2020-08-09 NOTE — Anesthesia Preprocedure Evaluation (Addendum)
Anesthesia Evaluation  Patient identified by MRN, date of birth, ID band Patient awake    Reviewed: Allergy & Precautions, NPO status , Patient's Chart, lab work & pertinent test results  Airway Mallampati: II  TM Distance: >3 FB Neck ROM: Full    Dental no notable dental hx. (+) Teeth Intact, Dental Advisory Given   Pulmonary neg pulmonary ROS,    Pulmonary exam normal breath sounds clear to auscultation       Cardiovascular hypertension, Pt. on medications Normal cardiovascular exam Rhythm:Regular Rate:Normal  HLD   Neuro/Psych PSYCHIATRIC DISORDERS Anxiety Depression negative neurological ROS     GI/Hepatic negative GI ROS, Neg liver ROS,   Endo/Other  negative endocrine ROSdiabetes, Type 2, Oral Hypoglycemic Agents  Renal/GU negative Renal ROS  negative genitourinary   Musculoskeletal negative musculoskeletal ROS (+)   Abdominal   Peds  Hematology negative hematology ROS (+)   Anesthesia Other Findings   Reproductive/Obstetrics                            Anesthesia Physical Anesthesia Plan  ASA: II  Anesthesia Plan: General   Post-op Pain Management:    Induction: Intravenous  PONV Risk Score and Plan: 3 and Midazolam, Dexamethasone and Ondansetron  Airway Management Planned: Oral ETT  Additional Equipment:   Intra-op Plan:   Post-operative Plan: Extubation in OR  Informed Consent: I have reviewed the patients History and Physical, chart, labs and discussed the procedure including the risks, benefits and alternatives for the proposed anesthesia with the patient or authorized representative who has indicated his/her understanding and acceptance.     Dental advisory given  Plan Discussed with: CRNA  Anesthesia Plan Comments:         Anesthesia Quick Evaluation

## 2020-08-09 NOTE — Transfer of Care (Signed)
Immediate Anesthesia Transfer of Care Note  Patient: Monique Soto  Procedure(s) Performed: LAPAROSCOPIC CHOLECYSTECTOMY (N/A Abdomen)  Patient Location: PACU  Anesthesia Type:General  Level of Consciousness: awake and oriented  Airway & Oxygen Therapy: Patient Spontanous Breathing and Patient connected to nasal cannula oxygen  Post-op Assessment: Report given to RN  Post vital signs: Reviewed and stable  Last Vitals:  Vitals Value Taken Time  BP    Temp    Pulse 84 08/09/20 1336  Resp 14 08/09/20 1336  SpO2 99 % 08/09/20 1336  Vitals shown include unvalidated device data.  Last Pain:  Vitals:   08/09/20 1121  TempSrc:   PainSc: 5       Patients Stated Pain Goal: 4 (08/09/20 1121)  Complications: No complications documented.

## 2020-08-09 NOTE — H&P (Signed)
Monique Soto is an 59 y.o. female.   Chief Complaint: abdominal pain HPI:  Pt is a 60 yo F who started having abdominal pain around 1.5 days prior to admission. She felt back pain initially after eating.  It progressively worsened and she developed significant n/v.  She has thrown up multiple times since arrival in the Ed despite 2 antiemetics.  She never had pain like this before.  Her mother has had GB disease.  She thought initially that throwing up would help pain, but it did not.  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    Past Medical History:  Diagnosis Date  . Knee pain   . Stress     Past Surgical History:  Procedure Laterality Date  . BLADDER SURGERY      History reviewed. No pertinent family history. Social History:  reports that she has never smoked. She has never used smokeless tobacco. No history on file for alcohol use and drug use.  Allergies:  Allergies  Allergen Reactions  . Codeine   . Doxycycline   . Sulfa Antibiotics Rash   Meds: celexa neurontin meloxicam ditropan  Results for orders placed or performed during the hospital encounter of 08/08/20 (from the past 48 hour(s))  Comprehensive metabolic panel     Status: Abnormal   Collection Time: 08/09/20 12:03 AM  Result Value Ref Range   Sodium 132 (L) 135 - 145 mmol/L   Potassium 4.2 3.5 - 5.1 mmol/L   Chloride 98 98 - 111 mmol/L   CO2 22 22 - 32 mmol/L   Glucose, Bld 164 (H) 70 - 99 mg/dL    Comment: Glucose reference range applies only to samples taken after fasting for at least 8 hours.   BUN 7 (L) 8 - 23 mg/dL   Creatinine, Ser 6.96 0.44 - 1.00 mg/dL   Calcium 9.1 8.9 - 29.5 mg/dL   Total Protein 7.1 6.5 - 8.1 g/dL   Albumin 4.0 3.5 - 5.0 g/dL   AST 20 15 - 41 U/L   ALT 24 0 - 44 U/L   Alkaline Phosphatase 62 38 - 126 U/L   Total Bilirubin 0.9 0.3 - 1.2 mg/dL   GFR, Estimated >28 >41 mL/min    Comment: (NOTE) Calculated using the CKD-EPI Creatinine Equation (2021)     Anion gap 12 5 - 15    Comment: Performed at [facility], [address], [place], [place] [address]  Lipase, blood     Status: None   Collection Time: 08/09/20 12:03 AM  Result Value Ref Range   Lipase 29 11 - 51 U/L    Comment: Performed at [facility], [address] [place], [place] [address]  CBC with Diff     Status: Abnormal   Collection Time: 08/09/20 12:03 AM  Result Value Ref Range   WBC 13.3 (H) 4.0 - 10.5 K/uL   RBC 4.59 3.87 - 5.11 MIL/uL   Hemoglobin 14.5 12.0 - 15.0 g/dL   HCT 53.6 64.4 - 03.4 %   MCV 90.8 80.0 - 100.0 fL   MCH 31.6 26.0 - 34.0 pg   MCHC 34.8 30.0 - 36.0 g/dL   RDW 74.2 59.5 - 63.8 %   Platelets 320 150 - 400 K/uL   nRBC 0.0 0.0 - 0.2 %   Neutrophils Relative % 84 %   Neutro Abs 11.2 (H) 1.7 - 7.7 K/uL   Lymphocytes Relative 10 %   Lymphs Abs 1.3  0.7 - 4.0 K/uL   Monocytes Relative 5 %   Monocytes Absolute 0.6 0.1 - 1.0 K/uL   Eosinophils Relative 0 %   Eosinophils Absolute 0.0 0.0 - 0.5 K/uL   Basophils Relative 1 %   Basophils Absolute 0.1 0.0 - 0.1 K/uL   Immature Granulocytes 0 %   Abs Immature Granulocytes 0.05 0.00 - 0.07 K/uL    Comment: Performed at [facility], [address] [place], [place] [address]  Resp Panel by RT-PCR (Flu A&B, Covid) Nasopharyngeal Swab     Status: None   Collection Time: 08/09/20  2:39 AM   Specimen: Nasopharyngeal Swab; Nasopharyngeal(NP) swabs in vial transport medium  Result Value Ref Range   SARS Coronavirus 2 by RT PCR NEGATIVE NEGATIVE    Comment: (NOTE) SARS-CoV-2 target nucleic acids are NOT DETECTED.  The SARS-CoV-2 RNA is generally detectable in upper respiratory specimens during the acute phase of infection. The lowest concentration of SARS-CoV-2 viral copies this assay can detect is 138 copies/mL. A negative result does not preclude SARS-Cov-2 infection and should not be used as the sole basis for treatment or other patient management decisions. A negative result may  occur with  improper specimen collection/handling, submission of specimen other than nasopharyngeal swab, presence of viral mutation(s) within the areas targeted by this assay, and inadequate number of viral copies(<138 copies/mL). A negative result must be combined with clinical observations, patient history, and epidemiological information. The expected result is Negative.  Fact Sheet for Patients:  BloggerCourse.com  Fact Sheet for Healthcare Providers:  SeriousBroker.it  This test is no t yet approved or cleared by the [place] FDA and  has been authorized for detection and/or diagnosis of SARS-CoV-2 by FDA under an Emergency Use Authorization (EUA). This EUA will remain  in effect (meaning this test can be used) for the duration of the COVID-19 declaration under Section 564(b)(1) of the Act, 21 U.S.C.section 360bbb-3(b)(1), unless the authorization is terminated  or revoked sooner.       Influenza A by PCR NEGATIVE NEGATIVE   Influenza B by PCR NEGATIVE NEGATIVE    Comment: (NOTE) The Xpert Xpress SARS-CoV-2/FLU/RSV plus assay is intended as an aid in the diagnosis of influenza from Nasopharyngeal swab specimens and should not be used as a sole basis for treatment. Nasal washings and aspirates are unacceptable for Xpert Xpress SARS-CoV-2/FLU/RSV testing.  Fact Sheet for Patients: BloggerCourse.com  Fact Sheet for Healthcare Providers: SeriousBroker.it  This test is not yet approved or cleared by the [place] FDA and has been authorized for detection and/or diagnosis of SARS-CoV-2 by FDA under an Emergency Use Authorization (EUA). This EUA will remain in effect (meaning this test can be used) for the duration of the COVID-19 declaration under Section 564(b)(1) of the Act, 21 U.S.C. section 360bbb-3(b)(1), unless the authorization is terminated  or revoked.  Performed at [facility], [address], [place], [place] [address]   Urinalysis, Routine w reflex microscopic     Status: Abnormal   Collection Time: 08/09/20  4:26 AM  Result Value Ref Range   Color, Urine YELLOW YELLOW   APPearance CLEAR CLEAR   Specific Gravity, Urine 1.036 (H) 1.005 - 1.030   pH 6.0 5.0 - 8.0   Glucose, UA >=500 (A) NEGATIVE mg/dL   Hgb urine dipstick SMALL (A) NEGATIVE   Bilirubin Urine NEGATIVE NEGATIVE   Ketones, ur 80 (A) NEGATIVE mg/dL   Protein, ur NEGATIVE NEGATIVE mg/dL   Nitrite NEGATIVE NEGATIVE  Leukocytes,Ua NEGATIVE NEGATIVE   RBC / HPF 0-5 0 - 5 RBC/hpf   WBC, UA 0-5 0 - 5 WBC/hpf   Bacteria, UA NONE SEEN NONE SEEN   Squamous Epithelial / LPF 0-5 0 - 5   Mucus PRESENT     Comment: Performed at [facility], [address] [place], [place] [id number]   DG Chest 2 View  Result Date: 08/09/2020 CLINICAL DATA:  Right upper quadrant pain EXAM: CHEST - 2 VIEW COMPARISON:  None. FINDINGS: The heart size and mediastinal contours are within normal limits. Both lungs are clear. The visualized skeletal structures are unremarkable. IMPRESSION: No active cardiopulmonary disease. Electronically Signed   By: Deatra RobinsonKevin  Herman M.D.   On: 08/09/2020 01:00   US Abdomen Limited  Result Date: 08/09/2020 CLINICAL DATA:  Right upper quadrant pain.  Pre-medicated. EXAM: ULTRASOUND ABDOMEN LIMITED RIGHT UPPER QUADRANT COMPARISON:  None. FINDINGS: Gallbladder: Hydropic gallbladder. Non-mobile 2.2 cm gallstone within the region of the gallbladder neck. Gallbladder sludge identified. Positive sonographic Murphy sign noted by sonographer. Common bile duct: Diameter: 7 mm Liver: No focal lesion identified. Increased parenchymal echogenicity. Portal vein is patent on color Doppler imaging with normal direction of blood flow towards the liver. Other: None. IMPRESSION: 1. Impacted gallstone within the gallbladder neck with associated acute cholecystitis.  Also noted gallbladder sludge. 2. Hepatic steatosis. Please note limited evaluation for focal hepatic masses in a patient with hepatic steatosis due to decreased penetration of the acoustic ultrasound waves. Electronically Signed   By: Tish FredericksonMorgane  Naveau M.D.   On: 08/09/2020 01:06    Review of Systems  Constitutional: Negative.   HENT: Negative.   Eyes: Negative.   Respiratory: Negative.   Cardiovascular: Negative.   Gastrointestinal: Positive for abdominal pain, nausea and vomiting.  Endocrine: Negative.   Genitourinary: Negative.   Musculoskeletal: Positive for back pain.  Skin: Negative.   Allergic/Immunologic: Negative.   Neurological: Negative.   Hematological: Negative.   Psychiatric/Behavioral: Negative.   All other systems reviewed and are negative.   Blood pressure (!) 147/85, pulse 76, temperature 98.6 F (37 C), temperature source Oral, resp. rate (!) 22, height 5\' 1"  (1.549 m), weight 92.1 kg, SpO2 100 %. Physical Exam Vitals reviewed.  Constitutional:      General: She is in acute distress.     Appearance: She is well-developed. She is obese. She is ill-appearing. She is not toxic-appearing or diaphoretic.  HENT:     Head: Normocephalic and atraumatic.  Eyes:     Extraocular Movements: Extraocular movements intact.     Pupils: Pupils are equal, round, and reactive to light.     Comments: Anicteric, conjunctiva not injected  Neck:     Thyroid: No thyromegaly.     Trachea: No tracheal deviation.  Cardiovascular:     Rate and Rhythm: Normal rate and regular rhythm.     Heart sounds: Normal heart sounds. No murmur heard.   Pulmonary:     Effort: Pulmonary effort is normal. No tachypnea, accessory muscle usage or respiratory distress.     Breath sounds: Normal breath sounds.  Abdominal:     General: There is no abdominal bruit.     Palpations: Abdomen is soft. There is no fluid wave, hepatomegaly, splenomegaly or mass.     Tenderness: There is abdominal  tenderness (epigastric, mild). There is no guarding or rebound.  Musculoskeletal:     Cervical back: Neck supple.  Skin:    General: Skin is warm and dry.  Capillary Refill: Capillary refill takes 2 to 3 seconds.     Coloration: Skin is pale. Skin is not cyanotic.  Neurological:     General: No focal deficit present.     Mental Status: She is alert and oriented to person, place, and time.     Cranial Nerves: No cranial nerve deficit.  Psychiatric:        Mood and Affect: Mood is anxious.        Behavior: Behavior normal.      Assessment/Plan Acute cholecystitis  NPO IV Fluids Antiemetics Pain control  OR for lap chole.  The surgical procedure was described to the patient in detail.   I discussed the incision type and location, the location of the gallbladder, the anatomy of the bile ducts and arteries, and the typical progression of surgery.   I advised of the risks of bleeding, infection, damage to other structures (such as the bile duct, intestine or liver), bile leak, need for other procedures or surgeries, and post op diarrhea/constipation. We discussed the recovery period and post operative restrictions.  The patient was advised against taking blood thinners the week before surgery.       Almond Lint, MD 08/09/2020, 6:36 AM

## 2020-08-09 NOTE — ED Provider Notes (Signed)
Emergency Medicine Provider Triage Evaluation Note  Monique Soto , a 60 y.o. female  was evaluated in triage.  Pt complains of epigastric abdominal pain with associated vomiting.  Also complains of chest pain.    Had recent knee surgery, but denies leg/calf pain or swelling.  Denies SOB or fever.    Recently started new DM meds.  Review of Systems  Positive: Epigastric pain, vomiting Negative: Fever, SOB  Physical Exam  There were no vitals taken for this visit. Gen:   Awake, no distress   Resp:  Normal effort  MSK:   Moves extremities without difficulty  Other:  Epigastric and RUQ TTP  Medical Decision Making  Medically screening exam initiated at 12:02 AM.  Appropriate orders placed.  Monique Soto was informed that the remainder of the evaluation will be completed by another provider, this initial triage assessment does not replace that evaluation, and the importance of remaining in the ED until their evaluation is complete.     Roxy Horseman, PA-C 08/09/20 0005    Dione Booze, MD 08/09/20 0300

## 2020-08-09 NOTE — Discharge Instructions (Signed)
[facility] LAPAROSCOPIC SURGERY: POST OP INSTRUCTIONS Always review your discharge instruction sheet given to you by the facility where your surgery was performed. IF YOU HAVE DISABILITY OR FAMILY LEAVE FORMS, YOU MUST BRING THEM TO THE OFFICE FOR PROCESSING.   DO NOT GIVE THEM TO YOUR DOCTOR.  PAIN CONTROL  1. First take acetaminophen (Tylenol) AND/or ibuprofen (Advil) to control your pain after surgery.  Follow directions on package.  Taking acetaminophen (Tylenol) and/or ibuprofen (Advil) regularly after surgery will help to control your pain and lower the amount of prescription pain medication you may need.  You should not take more than 3,000 mg (3 grams) of acetaminophen (Tylenol) in 24 hours.  You should not take ibuprofen (Advil), aleve, motrin, naprosyn or other NSAIDS if you have a history of stomach ulcers or chronic kidney disease.  2. A prescription for pain medication may be given to you upon discharge.  Take your pain medication as prescribed, if you still have uncontrolled pain after taking acetaminophen (Tylenol) or ibuprofen (Advil). 3. Use ice packs to help control pain. 4. If you need a refill on your pain medication, please contact your pharmacy.  They will contact our office to request authorization. Prescriptions will not be filled after 5pm or on week-ends.  HOME MEDICATIONS 5. Take your usually prescribed medications unless otherwise directed.  DIET 6. You should follow a light diet the first few days after arrival home.  Be sure to include lots of fluids daily. Avoid fatty, fried foods.   CONSTIPATION 7. It is common to experience some constipation after surgery and if you are taking pain medication.  Increasing fluid intake and taking a stool softener (such as Colace) will usually help or prevent this problem from occurring.  A mild laxative (Milk of Magnesia or Miralax) should be taken according to package instructions if there are no bowel  movements after 48 hours.  WOUND/INCISION CARE 8. Most patients will experience some swelling and bruising in the area of the incisions.  Ice packs will help.  Swelling and bruising can take several days to resolve.  9. Unless discharge instructions indicate otherwise, follow guidelines below  a. STERI-STRIPS - you may remove your outer bandages 48 hours after surgery, and you may shower at that time.  You have steri-strips (small skin tapes) in place directly over the incision.  These strips should be left on the skin for 7-10 days.   b. DERMABOND/SKIN GLUE - you may shower in 24 hours.  The glue will flake off over the next 2-3 weeks. 10. Any sutures or staples will be removed at the office during your follow-up visit.  ACTIVITIES 11. You may resume regular (light) daily activities beginning the next day--such as daily self-care, walking, climbing stairs--gradually increasing activities as tolerated.  You may have sexual intercourse when it is comfortable.  Refrain from any heavy lifting or straining until approved by your doctor. a. You may drive when you are no longer taking prescription pain medication, you can comfortably wear a seatbelt, and you can safely maneuver your car and apply brakes.  FOLLOW-UP 12. You should see your doctor in the office for a follow-up appointment approximately 2-3 weeks after your surgery.  You should have been given your post-op/follow-up appointment when your surgery was scheduled.  If you did not receive a post-op/follow-up appointment, make sure that you call for this appointment within a day or two after you arrive home to insure a convenient appointment time.     WHEN TO CALL YOUR DOCTOR: 1. Fever over 101.0 2. Inability to urinate 3. Continued bleeding from incision. 4. Increased pain, redness, or drainage from the incision. 5. Increasing abdominal pain  The clinic staff is available to answer your questions during regular business hours.  Please don't  hesitate to call and ask to speak to one of the nurses for clinical concerns.  If you have a medical emergency, go to the nearest emergency room or call 911.  A surgeon from [facility] is always on call at the hospital. [address], [address], [place], [place]  [address] ? [address] [address], , [place]   [address] [phone] ? [phone] ? FAX [phone] Web site: [email or web address]  .........   Managing Your Pain After Surgery Without Opioids    Thank you for participating in our program to help patients manage their pain after surgery without opioids. This is part of our effort to provide you with the best care possible, without exposing you or your family to the risk that opioids pose.  What pain can I expect after surgery? You can expect to have some pain after surgery. This is normal. The pain is typically worse the day after surgery, and quickly begins to get better. Many studies have found that many patients are able to manage their pain after surgery with Over-the-Counter (OTC) medications such as Tylenol and Motrin. If you have a condition that does not allow you to take Tylenol or Motrin, notify your surgical team.  How will I manage my pain? The best strategy for controlling your pain after surgery is around the clock pain control with Tylenol (acetaminophen) and Motrin (ibuprofen or Advil). Alternating these medications with each other allows you to maximize your pain control. In addition to Tylenol and Motrin, you can use heating pads or ice packs on your incisions to help reduce your pain.  How will I alternate your regular strength over-the-counter pain medication? You will take a dose of pain medication every three hours. ; Start by taking 650 mg of Tylenol (2 pills of 325 mg) ; 3 hours later take 600 mg of Motrin (3 pills of 200 mg) ; 3 hours after taking the Motrin take 650 mg of Tylenol ; 3 hours after that take 600 mg of  Motrin.   - 1 -  See example - if your first dose of Tylenol is at 12:00 PM   12:00 PM Tylenol 650 mg (2 pills of 325 mg)  3:00 PM Motrin 600 mg (3 pills of 200 mg)  6:00 PM Tylenol 650 mg (2 pills of 325 mg)  9:00 PM Motrin 600 mg (3 pills of 200 mg)  Continue alternating every 3 hours   We recommend that you follow this schedule around-the-clock for at least 3 days after surgery, or until you feel that it is no longer needed. Use the table on the last page of this handout to keep track of the medications you are taking. Important: Do not take more than 3000mg of Tylenol or 3200mg of Motrin in a 24-hour period. Do not take ibuprofen/Motrin if you have a history of bleeding stomach ulcers, severe kidney disease, &/or actively taking a blood thinner  What if I still have pain? If you have pain that is not controlled with the over-the-counter pain medications (Tylenol and Motrin or Advil) you might have what we call "breakthrough" pain. You will receive a prescription for a small amount of an opioid pain medication such as   Oxycodone, Tramadol, or Tylenol with Codeine. Use these opioid pills in the first 24 hours after surgery if you have breakthrough pain. Do not take more than 1 pill every 4-6 hours.  If you still have uncontrolled pain after using all opioid pills, don't hesitate to call our staff using the number provided. We will help make sure you are managing your pain in the best way possible, and if necessary, we can provide a prescription for additional pain medication.   Day 1    Time  Name of Medication Number of pills taken  Amount of Acetaminophen  Pain Level   Comments  AM PM       AM PM       AM PM       AM PM       AM PM       AM PM       AM PM       AM PM       Total Daily amount of Acetaminophen Do not take more than  3,000 mg per day      Day 2    Time  Name of Medication Number of pills taken  Amount of Acetaminophen  Pain Level   Comments  AM  PM       AM PM       AM PM       AM PM       AM PM       AM PM       AM PM       AM PM       Total Daily amount of Acetaminophen Do not take more than  3,000 mg per day      Day 3    Time  Name of Medication Number of pills taken  Amount of Acetaminophen  Pain Level   Comments  AM PM       AM PM       AM PM       AM PM          AM PM       AM PM       AM PM       AM PM       Total Daily amount of Acetaminophen Do not take more than  3,000 mg per day      Day 4    Time  Name of Medication Number of pills taken  Amount of Acetaminophen  Pain Level   Comments  AM PM       AM PM       AM PM       AM PM       AM PM       AM PM       AM PM       AM PM       Total Daily amount of Acetaminophen Do not take more than  3,000 mg per day      Day 5    Time  Name of Medication Number of pills taken  Amount of Acetaminophen  Pain Level   Comments  AM PM       AM PM       AM PM       AM PM       AM PM       AM PM       AM PM         AM PM       Total Daily amount of Acetaminophen Do not take more than  3,000 mg per day       Day 6    Time  Name of Medication Number of pills taken  Amount of Acetaminophen  Pain Level  Comments  AM PM       AM PM       AM PM       AM PM       AM PM       AM PM       AM PM       AM PM       Total Daily amount of Acetaminophen Do not take more than  3,000 mg per day      Day 7    Time  Name of Medication Number of pills taken  Amount of Acetaminophen  Pain Level   Comments  AM PM       AM PM       AM PM       AM PM       AM PM       AM PM       AM PM       AM PM       Total Daily amount of Acetaminophen Do not take more than  3,000 mg per day        For additional information about how and where to safely dispose of unused opioid medications - https://www.morepowerfulnc.org  Disclaimer: This document contains information and/or instructional materials adapted from [facility]  for the typical patient with your condition. It does not replace medical advice from your health care provider because your experience may differ from that of the typical patient. Talk to your health care provider if you have any questions about this document, your condition or your treatment plan. Adapted from [facility]  

## 2020-08-09 NOTE — Op Note (Signed)
Preoperative diagnosis: acute cholecystitis Postoperative diagnosis: acute cholecystitis Procedure: Laparoscopic cholecystectomy Surgeon: Dr. Harden Mo Assistant: Barnetta Chapel, PA-C Anesthesia: General Estimated blood loss: 50 cc Specimens: Gallbladder and contents to pathology Complications: None Drains: None Sponge and count was correct completion Decision to recovery stable condition  Indications:59 yof with ruq pain, elevated wbc and on [place] has a hydropic gallbladder, 2.2 cm gallstone impacted at the neck with positive sonographic Murphys sign. I discussed lap chole with her.   Procedure: After informed consent was obtained the patient was taken to the OR.  She was given antibiotics.  SCDs were in place.  She was placed under general anesthesia without complication.  She was prepped and draped in the standard sterile surgical fashion.  Surgical timeout was then performed.  I filtrated Marcaine and made a curvilinear incision below the umbilicus.  I then incised the fascia and entered the peritoneum bluntly.  I then placed a 0 Vicryl pursestring suture around the fascia.  I inserted a Hassan trocar and insufflated the abdomen to 15 mmHg pressure.  I then inserted 3 additional 5 mm trocars in the epigastrium and right upper quadrant under direct vision without complication.  The gallbladder was tense and adherent to the omentum. Just to grasp it I aspirated it and there was purulence present.  I then had to with a combination of blunt and sharp dissection was able to free the gallbladder from the omentum and the duodenum.   It was then retracted cephalad and lateral.  The triangle had a lot of scarring present in it.  Eventually I was able to dissect the triangle and clearly obtain the critical view of safety.  I released the gallbladder some from the cystic plate as well.  I then clipped the artery 3 times and divided it leaving a clip in place.  I then treated the duct in a similar  fashion.  The clips completely traversed the duct and the duct was viable.  I then removed the gallbladder with some difficulty from the liver bed as it was very adherent.  I then placed the gallbladder in a retrieval bag.  This was removed from the umbilical port site.  I obtained hemostasis.  The liver was very friable.  I did place a piece of Surgicel snow in it at completion.  I had irrigated until this was clear.  I then removed my Hassan trocar and tied my pursestring down.  I then placed 2 additional 0 vicryl sutures with the suture passer device.    The abdomen was then desufflated and the remaining trocars removed.  I then closed these with 4-0 Monocryl.  Glue and Steri-Strips were applied.  She tolerated this well was extubated and transferred to recovery stable.

## 2020-08-10 ENCOUNTER — Encounter (HOSPITAL_COMMUNITY): Payer: Self-pay | Admitting: General Surgery

## 2020-08-10 ENCOUNTER — Other Ambulatory Visit (HOSPITAL_COMMUNITY): Payer: Self-pay

## 2020-08-10 LAB — COMPREHENSIVE METABOLIC PANEL
ALT: 39 U/L (ref 0–44)
AST: 41 U/L (ref 15–41)
Albumin: 3.3 g/dL — ABNORMAL LOW (ref 3.5–5.0)
Alkaline Phosphatase: 51 U/L (ref 38–126)
Anion gap: 7 (ref 5–15)
BUN: 8 mg/dL (ref 8–23)
CO2: 28 mmol/L (ref 22–32)
Calcium: 8.9 mg/dL (ref 8.9–10.3)
Chloride: 102 mmol/L (ref 98–111)
Creatinine, Ser: 0.72 mg/dL (ref 0.44–1.00)
GFR, Estimated: >60 mL/min (ref >60)
Glucose, Bld: 99 mg/dL (ref 70–99)
Potassium: 4.2 mmol/L (ref 3.5–5.1)
Sodium: 137 mmol/L (ref 135–145)
Total Bilirubin: 0.8 mg/dL (ref 0.3–1.2)
Total Protein: 6.2 g/dL — ABNORMAL LOW (ref 6.5–8.1)

## 2020-08-10 LAB — CBC
HCT: 38.4 % (ref 36.0–46.0)
Hemoglobin: 13.2 g/dL (ref 12.0–15.0)
MCH: 31.8 pg (ref 26.0–34.0)
MCHC: 34.4 g/dL (ref 30.0–36.0)
MCV: 92.5 fL (ref 80.0–100.0)
Platelets: 280 10*3/uL (ref 150–400)
RBC: 4.15 MIL/uL (ref 3.87–5.11)
RDW: 12.2 % (ref 11.5–15.5)
WBC: 11.8 10*3/uL — ABNORMAL HIGH (ref 4.0–10.5)
nRBC: 0 % (ref 0.0–0.2)

## 2020-08-10 LAB — GLUCOSE, CAPILLARY
Glucose-Capillary: 153 mg/dL — ABNORMAL HIGH (ref 70–99)
Glucose-Capillary: 107 mg/dL — ABNORMAL HIGH (ref 70–99)

## 2020-08-10 MED ORDER — ACETAMINOPHEN 500 MG PO TABS: 500.0000 mg | ORAL_TABLET | Freq: Four times a day (QID) | ORAL | 0 refills | Status: AC | PRN

## 2020-08-10 MED ORDER — HYDROCODONE-ACETAMINOPHEN 10-325 MG PO TABS
1.0000 | ORAL_TABLET | Freq: Four times a day (QID) | ORAL | 0 refills | Status: AC | PRN
  Filled 2020-08-10: qty 10, 3d supply, fill #0

## 2020-08-10 MED ORDER — METHOCARBAMOL 500 MG PO TABS
500.0000 mg | ORAL_TABLET | Freq: Four times a day (QID) | ORAL | 0 refills | Status: AC | PRN
  Filled 2020-08-10: qty 20, 5d supply, fill #0

## 2020-08-10 NOTE — Anesthesia Postprocedure Evaluation (Signed)
Anesthesia Post Note  Patient: Monique Soto  Procedure(s) Performed: LAPAROSCOPIC CHOLECYSTECTOMY (N/A Abdomen)     Patient location during evaluation: PACU Anesthesia Type: General Level of consciousness: awake and alert Pain management: pain level controlled Vital Signs Assessment: post-procedure vital signs reviewed and stable Respiratory status: spontaneous breathing, nonlabored ventilation, respiratory function stable and patient connected to nasal cannula oxygen Cardiovascular status: blood pressure returned to baseline and stable Postop Assessment: no apparent nausea or vomiting Anesthetic complications: no   No complications documented.  Last Vitals:  Vitals:   08/10/20 0352 08/10/20 0713  BP: (!) 98/56 95/63  Pulse: (!) 58 77  Resp: 18   Temp: 36.6 C 36.6 C  SpO2: 98% 96%    Last Pain:  Vitals:   08/10/20 0713  TempSrc: Oral  PainSc:                  Charlane Westry L Lydon Vansickle

## 2020-08-10 NOTE — Plan of Care (Signed)
Adequately ready for discharge 

## 2020-08-10 NOTE — Progress Notes (Signed)
1 Day Post-Op   Subjective/Chief Complaint: Feels well, much better today   Objective: Vital signs in last 24 hours: Temp:  [97.7 F (36.5 C)-98.2 F (36.8 C)] 97.8 F (36.6 C) (05/24 0713) Pulse Rate:  [52-92] 77 (05/24 0713) Resp:  [11-19] 18 (05/24 0352) BP: (95-152)/(52-90) 95/63 (05/24 0713) SpO2:  [90 %-99 %] 96 % (05/24 0713) Weight:  [92.1 kg] 92.1 kg (05/23 1105) Last BM Date: 08/07/20  Intake/Output from previous day: 05/23 0701 - 05/24 0700 In: 100 [IV Piggyback:100] Out: 260 [Urine:250; Blood:10] Intake/Output this shift: No intake/output data recorded.  GI: soft approp tender nondistended incisions clean  Lab Results:  Recent Labs    08/09/20 1602 08/10/20 0503  WBC 16.6* 11.8*  HGB 14.3 13.2  HCT 41.9 38.4  PLT 297 280   BMET Recent Labs    08/09/20 0003 08/09/20 1602 08/10/20 0503  NA 132*  --  137  K 4.2  --  4.2  CL 98  --  102  CO2 22  --  28  GLUCOSE 164*  --  99  BUN 7*  --  8  CREATININE 0.78 0.71 0.72  CALCIUM 9.1  --  8.9   PT/INR No results for input(s): LABPROT, INR in the last 72 hours. ABG No results for input(s): PHART, HCO3 in the last 72 hours.  Invalid input(s): PCO2, PO2  Studies/Results: DG Chest 2 View  Result Date: 08/09/2020 CLINICAL DATA:  Right upper quadrant pain EXAM: CHEST - 2 VIEW COMPARISON:  None. FINDINGS: The heart size and mediastinal contours are within normal limits. Both lungs are clear. The visualized skeletal structures are unremarkable. IMPRESSION: No active cardiopulmonary disease. Electronically Signed   By: Deatra Robinson M.D.   On: 08/09/2020 01:00   US Abdomen Limited  Result Date: 08/09/2020 CLINICAL DATA:  Right upper quadrant pain.  Pre-medicated. EXAM: ULTRASOUND ABDOMEN LIMITED RIGHT UPPER QUADRANT COMPARISON:  None. FINDINGS: Gallbladder: Hydropic gallbladder. Non-mobile 2.2 cm gallstone within the region of the gallbladder neck. Gallbladder sludge identified. Positive sonographic Murphy  sign noted by sonographer. Common bile duct: Diameter: 7 mm Liver: No focal lesion identified. Increased parenchymal echogenicity. Portal vein is patent on color Doppler imaging with normal direction of blood flow towards the liver. Other: None. IMPRESSION: 1. Impacted gallstone within the gallbladder neck with associated acute cholecystitis. Also noted gallbladder sludge. 2. Hepatic steatosis. Please note limited evaluation for focal hepatic masses in a patient with hepatic steatosis due to decreased penetration of the acoustic ultrasound waves. Electronically Signed   By: Tish Frederickson M.D.   On: 08/09/2020 01:06    Anti-infectives: Anti-infectives (From admission, onward)   Start     Dose/Rate Route Frequency Ordered Stop   08/09/20 1145  cefTRIAXone (ROCEPHIN) 2 g in sodium chloride 0.9 % 100 mL IVPB        2 g 200 mL/hr over 30 Minutes Intravenous  Once 08/09/20 1137 08/09/20 1212   08/09/20 1139  sodium chloride 0.9 % with cefTRIAXone (ROCEPHIN) ADS Med       Note to Pharmacy: Phebe Colla   : cabinet override      08/09/20 1139 08/09/20 1239      Assessment/Plan: POD 1 lap chole -will recheck soon and plan to send home  Monique Soto 08/10/2020

## 2020-08-10 NOTE — Discharge Summary (Signed)
Patient ID: Monique Soto [id number] 1960/09/01 60 y.o.  Admit date: 08/08/2020 Discharge date: 08/10/2020  Admitting Diagnosis: cholecystitis  Discharge Diagnosis Patient Active Problem List   Diagnosis Date Noted  . Acute cholecystitis 08/09/2020    Consultants none  Reason for Admission: Pt is a 60 yo F who started having abdominal pain around 1.5 days prior to admission. She felt back pain initially after eating.  It progressively worsened and she developed significant n/v.  She has thrown up multiple times since arrival in the Ed despite 2 antiemetics.  She never had pain like this before.  Her mother has had GB disease.  She thought initially that throwing up would help pain, but it did not.  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    Procedures Lap chole, Dr. Dwain Sarna 08/09/20  Hospital Course:  The patient was admitted and underwent a laparoscopic cholecystectomy.  The patient tolerated the procedure well.  On POD 1, the patient was tolerating a regular diet, voiding well, mobilizing, and pain was controlled with oral pain medications.  The patient was stable for DC home at this time with appropriate follow up made.   Allergies as of 08/10/2020      Reactions   Codeine Rash   Stomach bleeding   Doxycycline Rash   Sulfa Antibiotics Rash      Medication List    TAKE these medications   acetaminophen 500 MG tablet Commonly known as: TYLENOL Take 1 tablet (500 mg total) by mouth every 6 (six) hours as needed.   atorvastatin 10 MG tablet Commonly known as: LIPITOR Take 10 mg by mouth daily.   buPROPion 150 MG 12 hr tablet Commonly known as: WELLBUTRIN SR Take 150 mg by mouth 2 (two) times daily.   gabapentin 300 MG capsule Commonly known as: NEURONTIN Take 1 capsule (300 mg total) by mouth at bedtime. What changed:   when to take this  reasons to take this   HYDROcodone-acetaminophen 10-325 MG tablet Commonly known as: NORCO Take  1 tablet by mouth every 6 (six) hours as needed for moderate pain. What changed:   when to take this  reasons to take this   lisinopril 2.5 MG tablet Commonly known as: ZESTRIL Take 2.5 mg by mouth every Monday, Wednesday, and Friday.   loratadine 10 MG tablet Commonly known as: CLARITIN Take 10 mg by mouth daily.   methocarbamol 500 MG tablet Commonly known as: ROBAXIN Take 1 tablet (500 mg total) by mouth every 6 (six) hours as needed for muscle spasms.   oxybutynin 10 MG 24 hr tablet Commonly known as: DITROPAN-XL Take 10 mg by mouth daily.   Ozempic (0.25 or 0.5 MG/DOSE) 2 MG/1.5ML Sopn Generic drug: Semaglutide(0.25 or 0.5MG /DOS) Inject 0.25-0.5 mg into the skin See admin instructions. Inject 0.25mg  once weekly for two weeks, then increase to 0.5mg  once weekly thereafter.  SATURDAYS   Synjardy XR 25-1000 MG Tb24 Generic drug: Empagliflozin-metFORMIN HCl ER Take 1 tablet by mouth daily.         Follow-up Information    [facility], [facility]. Go on 08/31/2020.   Specialty: General Surgery Why: 9:15 AM. Please arrive 30 min prior to appointment time. Bring photo ID and insurance information. Contact information: [address] [place] [address] [phone]               Signed: Barnetta Chapel, [facility] 08/10/2020, 11:32 AM Please see Amion for pager number during  day hours 7:00am-4:30pm, 7-11:30am on Weekends

## 2020-08-10 NOTE — Progress Notes (Signed)
Patient alert and oriented, voiding adequately, skin clean, dry and intact without evidence of skin break down, or symptoms of complications - no redness or edema noted, only slight tenderness at site.  Patient states pain is manageable at time of discharge. Patient has an appointment with MD June 14th @ 09:15

## 2020-08-11 LAB — SURGICAL PATHOLOGY

## 2022-07-26 IMAGING — US US ABDOMEN LIMITED
1 series · 14 of 25 positions shown · non-contrast
Comparison: None.

CLINICAL DATA: Right upper quadrant pain.  Pre-medicated.

EXAM:
ULTRASOUND ABDOMEN LIMITED RIGHT UPPER QUADRANT

[Series 1: us abdomen limited · 14 of 88 slices shown]
[im 1/88]
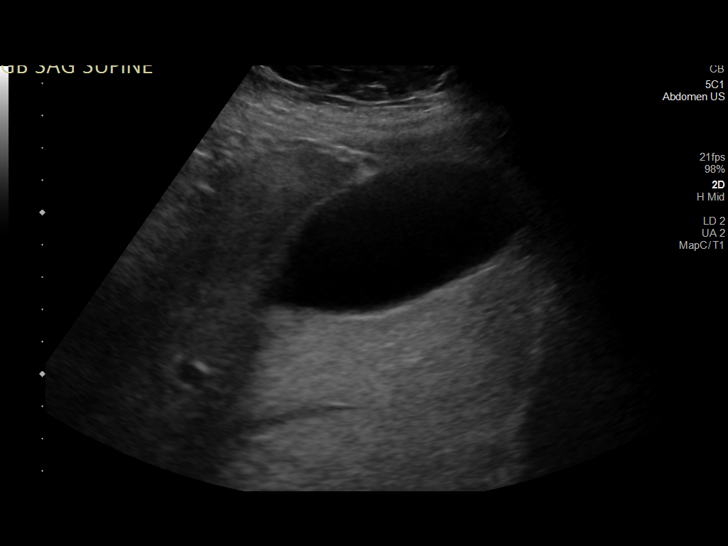
[im 8/88]
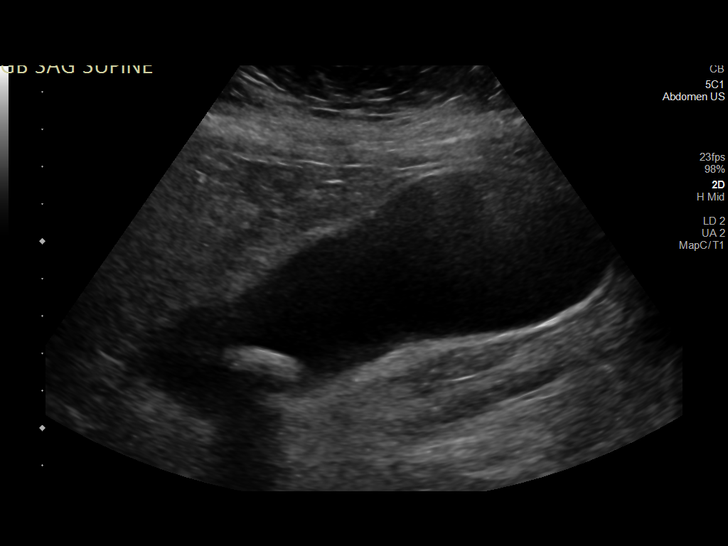
[im 15/88]
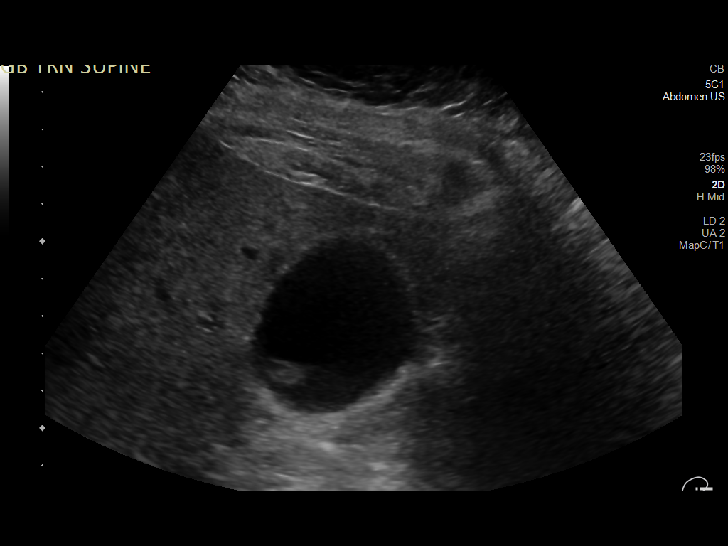
[im 22/88]
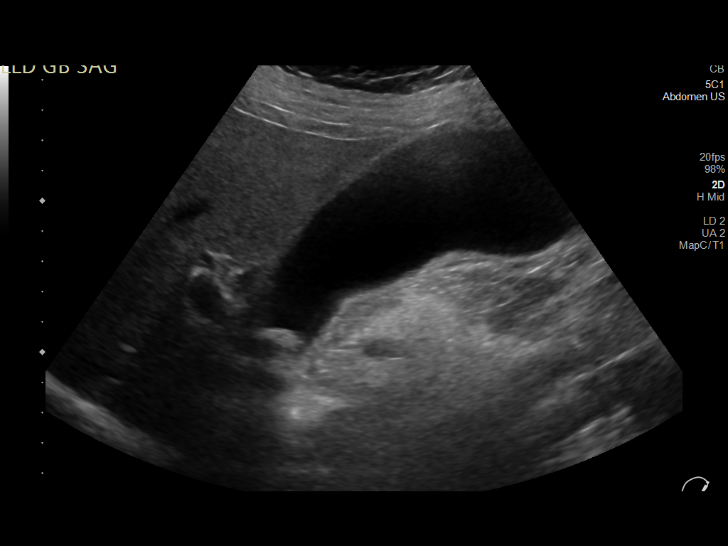
[im 30/88]
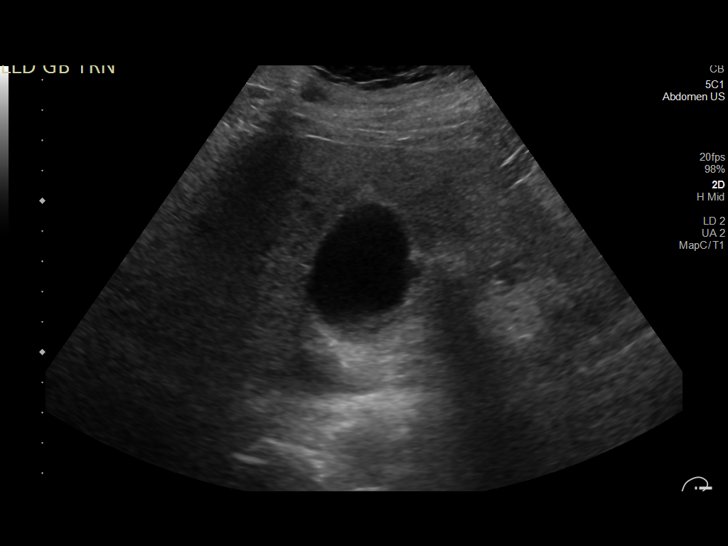
[im 33/88]
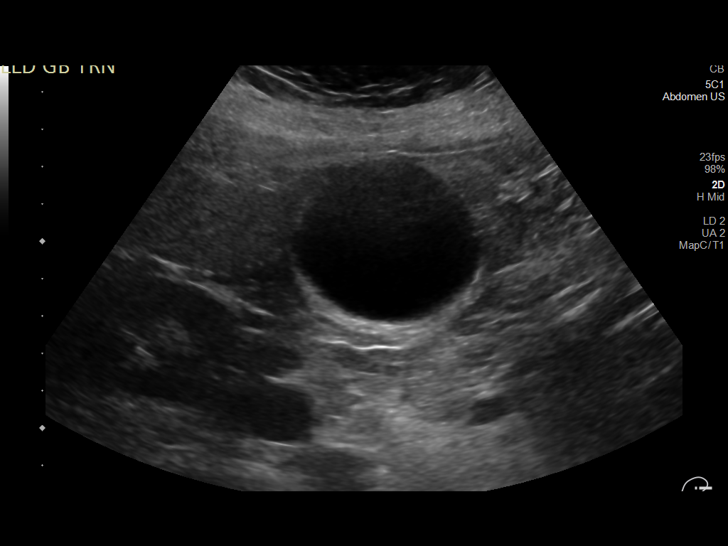
[im 40/88]
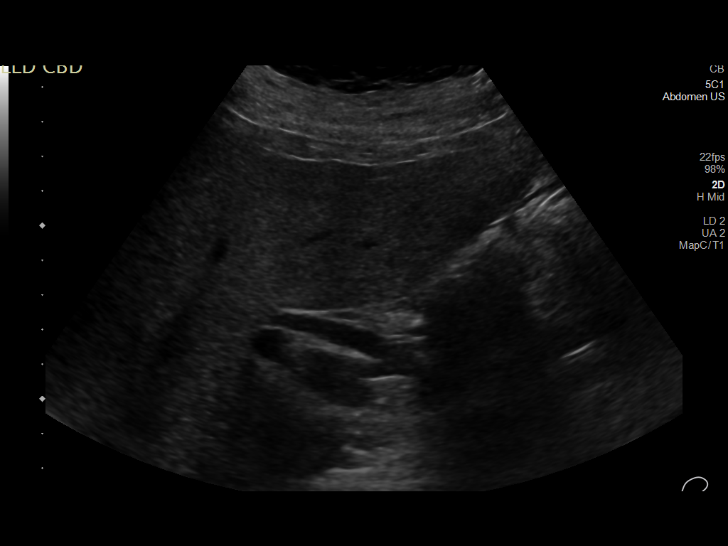
[im 48/88]
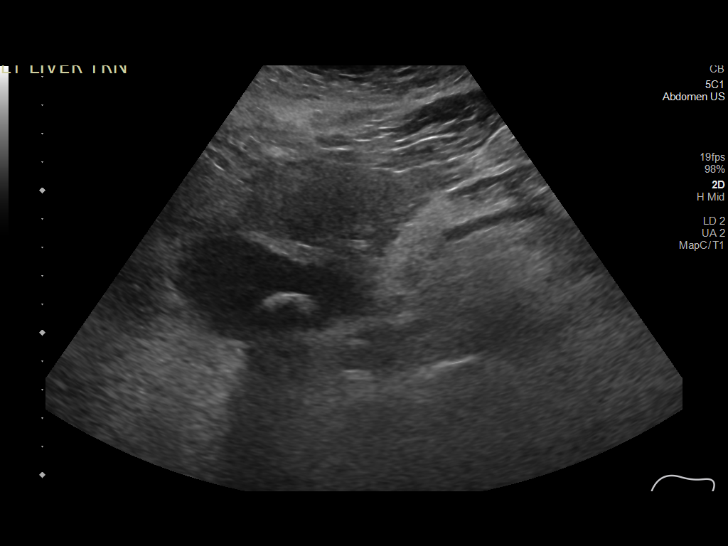
[im 55/88]
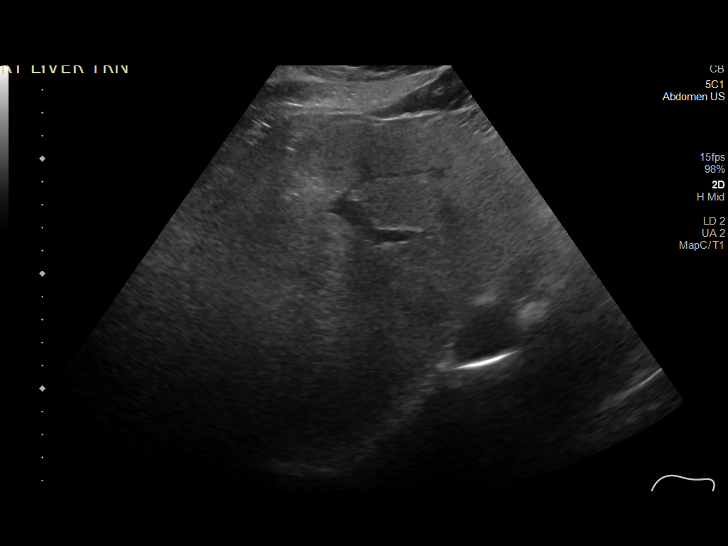
[im 59/88]
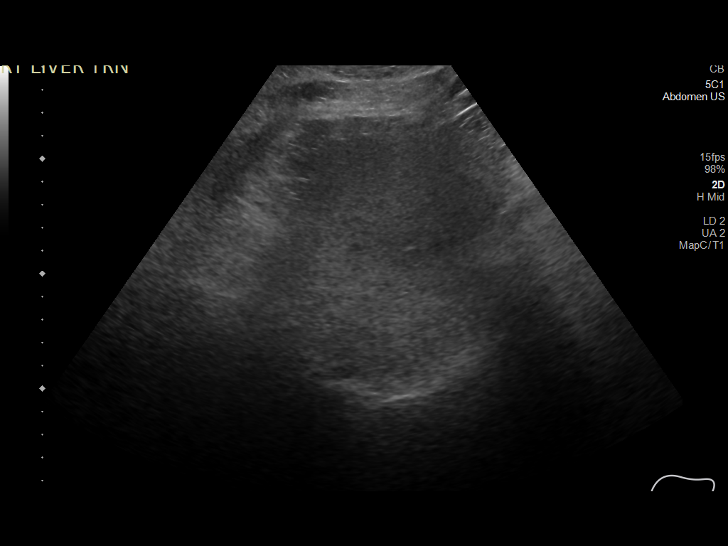
[im 66/88]
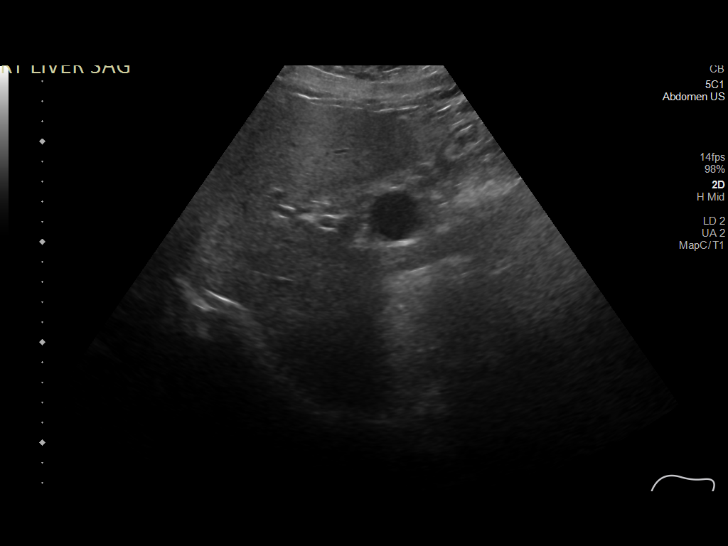
[im 73/88]
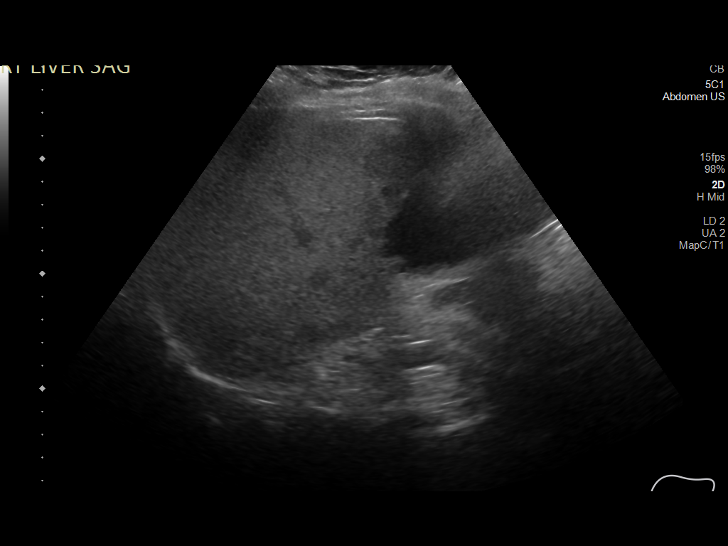
[im 80/88]
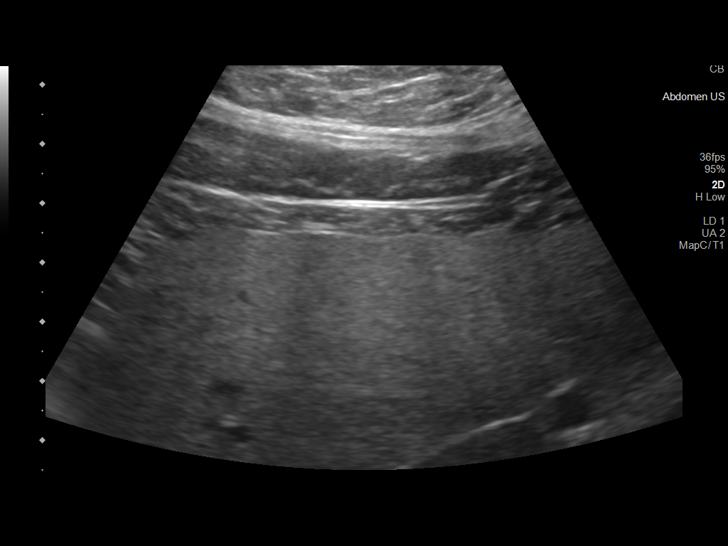
[im 88/88]
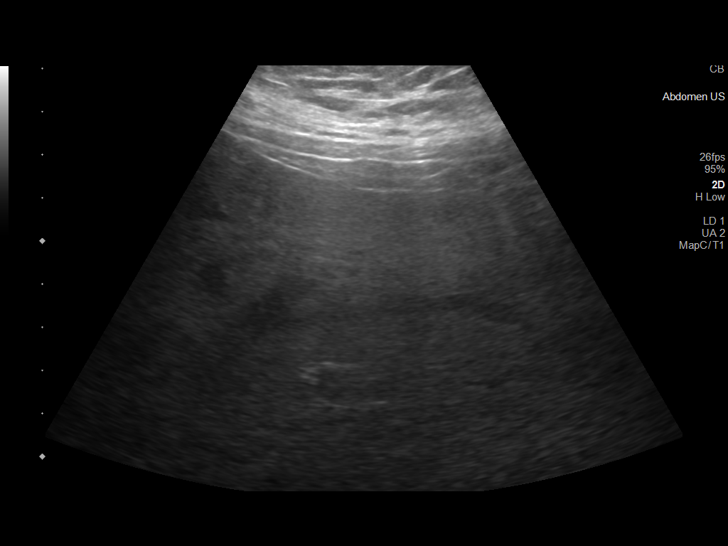

[14 of 25 positions shown; findings below may reference images not displayed]

FINDINGS: Gallbladder:

Hydropic gallbladder. Non-mobile 2.2 cm gallstone within the region
of the gallbladder neck. Gallbladder sludge identified. Positive
sonographic Murphy sign noted by sonographer.

Common bile duct:

Diameter: 7 mm

Liver:

No focal lesion identified. Increased parenchymal echogenicity.
Portal vein is patent on color Doppler imaging with normal direction
of blood flow towards the liver.

Other: None.
IMPRESSION: 1. Impacted gallstone within the gallbladder neck with associated
acute cholecystitis. Also noted gallbladder sludge.
2. Hepatic steatosis. Please note limited evaluation for focal
hepatic masses in a patient with hepatic steatosis due to decreased
penetration of the acoustic ultrasound waves.
